# Patient Record
Sex: Female | Born: 1940 | Race: Black or African American | Hispanic: No | Marital: Single
Health system: Southern US, Community
[De-identification: ages and names within clinical notes are randomized; demographics above are authoritative.]

## PROBLEM LIST (undated history)

## (undated) DIAGNOSIS — G9341 Metabolic encephalopathy: Secondary | ICD-10-CM

## (undated) DIAGNOSIS — E079 Disorder of thyroid, unspecified: Secondary | ICD-10-CM

## (undated) DIAGNOSIS — M5440 Lumbago with sciatica, unspecified side: Secondary | ICD-10-CM

## (undated) DIAGNOSIS — B353 Tinea pedis: Secondary | ICD-10-CM

## (undated) DIAGNOSIS — F0280 Dementia in other diseases classified elsewhere without behavioral disturbance: Secondary | ICD-10-CM

## (undated) DIAGNOSIS — G309 Alzheimer's disease, unspecified: Secondary | ICD-10-CM

---

## 2005-05-10 ENCOUNTER — Ambulatory Visit: Payer: Self-pay | Admitting: Emergency Medicine

## 2006-05-13 ENCOUNTER — Ambulatory Visit: Payer: Self-pay | Admitting: Family Medicine

## 2007-07-03 ENCOUNTER — Ambulatory Visit: Payer: Self-pay

## 2007-12-24 ENCOUNTER — Ambulatory Visit: Payer: Self-pay

## 2008-03-17 ENCOUNTER — Ambulatory Visit: Payer: Self-pay | Admitting: Unknown Physician Specialty

## 2008-07-13 ENCOUNTER — Ambulatory Visit: Payer: Self-pay

## 2009-10-12 ENCOUNTER — Ambulatory Visit: Payer: Self-pay | Admitting: Family Medicine

## 2011-02-27 ENCOUNTER — Ambulatory Visit: Payer: Self-pay | Admitting: Urology

## 2011-04-09 ENCOUNTER — Ambulatory Visit: Payer: Self-pay | Admitting: Urology

## 2011-07-31 ENCOUNTER — Ambulatory Visit: Payer: Self-pay | Admitting: Ophthalmology

## 2011-07-31 DIAGNOSIS — I119 Hypertensive heart disease without heart failure: Secondary | ICD-10-CM

## 2011-08-08 ENCOUNTER — Ambulatory Visit: Payer: Self-pay | Admitting: Ophthalmology

## 2012-01-14 ENCOUNTER — Ambulatory Visit: Payer: Self-pay | Admitting: Unknown Physician Specialty

## 2012-01-21 ENCOUNTER — Ambulatory Visit: Payer: Self-pay | Admitting: Unknown Physician Specialty

## 2013-01-26 ENCOUNTER — Ambulatory Visit: Payer: Self-pay | Admitting: Unknown Physician Specialty

## 2013-01-28 ENCOUNTER — Ambulatory Visit: Payer: Self-pay | Admitting: Unknown Physician Specialty

## 2013-08-19 LAB — CBC WITH DIFFERENTIAL/PLATELET
Basophil #: 0 10*3/uL (ref 0.0–0.1)
Basophil %: 0.4 %
HCT: 34.4 % — ABNORMAL LOW (ref 35.0–47.0)
HGB: 11.8 g/dL — ABNORMAL LOW (ref 12.0–16.0)
MCHC: 34.3 g/dL (ref 32.0–36.0)
MCV: 98 fL (ref 80–100)
Monocyte %: 7.8 %
Neutrophil #: 4.2 10*3/uL (ref 1.4–6.5)
Platelet: 164 10*3/uL (ref 150–440)
RDW: 14.2 % (ref 11.5–14.5)

## 2013-08-20 ENCOUNTER — Inpatient Hospital Stay: Payer: Self-pay | Admitting: Internal Medicine

## 2013-08-20 LAB — COMPREHENSIVE METABOLIC PANEL
Albumin: 4 g/dL (ref 3.4–5.0)
Anion Gap: 7 (ref 7–16)
Bilirubin,Total: 0.4 mg/dL (ref 0.2–1.0)
Calcium, Total: 8.4 mg/dL — ABNORMAL LOW (ref 8.5–10.1)
Co2: 29 mmol/L (ref 21–32)
Creatinine: 0.86 mg/dL (ref 0.60–1.30)
EGFR (African American): >60
EGFR (Non-African Amer.): >60
Glucose: 134 mg/dL — ABNORMAL HIGH (ref 65–99)
SGOT(AST): 72 U/L — ABNORMAL HIGH (ref 15–37)
Total Protein: 7.6 g/dL (ref 6.4–8.2)

## 2013-08-20 LAB — URINALYSIS, COMPLETE
Bacteria: NONE SEEN
Glucose,UR: NEGATIVE mg/dL (ref 0–75)
Ketone: NEGATIVE
Leukocyte Esterase: NEGATIVE
Nitrite: NEGATIVE
Ph: 8 (ref 4.5–8.0)
Protein: 100
Specific Gravity: 1.01 (ref 1.003–1.030)
WBC UR: 2 /HPF (ref 0–5)

## 2013-08-20 LAB — DRUG SCREEN, URINE
Barbiturates, Ur Screen: NEGATIVE (ref ?–200)
Benzodiazepine, Ur Scrn: NEGATIVE (ref ?–200)
MDMA (Ecstasy)Ur Screen: NEGATIVE (ref ?–500)
Methadone, Ur Screen: NEGATIVE (ref ?–300)
Opiate, Ur Screen: NEGATIVE (ref ?–300)

## 2013-08-20 LAB — SEDIMENTATION RATE: Erythrocyte Sed Rate: 29 mm/hr (ref 0–30)

## 2013-08-20 LAB — FOLATE: Folic Acid: 11.8 ng/mL (ref 3.1–100.0)

## 2013-08-20 LAB — TROPONIN I: Troponin-I: <0.02 ng/mL

## 2013-08-20 LAB — CK TOTAL AND CKMB (NOT AT ARMC)
CK, Total: 970 U/L — ABNORMAL HIGH (ref 21–215)
CK, Total: 715 U/L — ABNORMAL HIGH (ref 21–215)
CK-MB: 8.5 ng/mL — ABNORMAL HIGH (ref 0.5–3.6)
CK-MB: 5.1 ng/mL — ABNORMAL HIGH (ref 0.5–3.6)

## 2013-08-20 LAB — T4, FREE: Free Thyroxine: 0.3 ng/dL — ABNORMAL LOW (ref 0.76–1.46)

## 2013-08-20 LAB — TSH: Thyroid Stimulating Horm: 96.4 u[IU]/mL — ABNORMAL HIGH

## 2013-08-21 LAB — CBC WITH DIFFERENTIAL/PLATELET
Basophil %: 0.7 %
Eosinophil #: 0 10*3/uL (ref 0.0–0.7)
Eosinophil %: 0.6 %
HCT: 31.1 % — ABNORMAL LOW (ref 35.0–47.0)
HGB: 10.7 g/dL — ABNORMAL LOW (ref 12.0–16.0)
MCHC: 34.4 g/dL (ref 32.0–36.0)
MCV: 98 fL (ref 80–100)
Monocyte #: 0.4 x10 3/mm (ref 0.2–0.9)
Monocyte %: 7.5 %
Neutrophil #: 3 10*3/uL (ref 1.4–6.5)
RBC: 3.18 10*6/uL — ABNORMAL LOW (ref 3.80–5.20)
WBC: 5 10*3/uL (ref 3.6–11.0)

## 2013-08-21 LAB — BASIC METABOLIC PANEL
Anion Gap: 6 — ABNORMAL LOW (ref 7–16)
Calcium, Total: 8.2 mg/dL — ABNORMAL LOW (ref 8.5–10.1)
Chloride: 106 mmol/L (ref 98–107)
Co2: 27 mmol/L (ref 21–32)
Creatinine: 1.05 mg/dL (ref 0.60–1.30)
EGFR (African American): >60
EGFR (Non-African Amer.): 52 — ABNORMAL LOW
Glucose: 138 mg/dL — ABNORMAL HIGH (ref 65–99)
Potassium: 3.8 mmol/L (ref 3.5–5.1)
Sodium: 139 mmol/L (ref 136–145)

## 2013-08-21 LAB — CK TOTAL AND CKMB (NOT AT ARMC): CK, Total: 562 U/L — ABNORMAL HIGH (ref 21–215)

## 2013-08-22 LAB — BASIC METABOLIC PANEL
Calcium, Total: 8 mg/dL — ABNORMAL LOW (ref 8.5–10.1)
Chloride: 107 mmol/L (ref 98–107)
Co2: 26 mmol/L (ref 21–32)
EGFR (African American): >60
EGFR (Non-African Amer.): 52 — ABNORMAL LOW
Glucose: 130 mg/dL — ABNORMAL HIGH (ref 65–99)
Potassium: 4 mmol/L (ref 3.5–5.1)

## 2013-08-23 LAB — MAGNESIUM: Magnesium: 1.8 mg/dL

## 2013-08-23 LAB — BASIC METABOLIC PANEL
Anion Gap: 5 — ABNORMAL LOW (ref 7–16)
BUN: 10 mg/dL (ref 7–18)
Chloride: 105 mmol/L (ref 98–107)
Co2: 29 mmol/L (ref 21–32)
Creatinine: 1.08 mg/dL (ref 0.60–1.30)
Glucose: 118 mg/dL — ABNORMAL HIGH (ref 65–99)
Potassium: 3.9 mmol/L (ref 3.5–5.1)
Sodium: 139 mmol/L (ref 136–145)

## 2013-08-23 LAB — CK: CK, Total: 380 U/L — ABNORMAL HIGH (ref 21–215)

## 2013-08-24 LAB — BASIC METABOLIC PANEL
Calcium, Total: 8.7 mg/dL (ref 8.5–10.1)
Chloride: 103 mmol/L (ref 98–107)
EGFR (African American): 58 — ABNORMAL LOW
Glucose: 112 mg/dL — ABNORMAL HIGH (ref 65–99)
Osmolality: 276 (ref 275–301)
Potassium: 3.9 mmol/L (ref 3.5–5.1)
Sodium: 138 mmol/L (ref 136–145)

## 2013-08-24 LAB — MAGNESIUM: Magnesium: 2 mg/dL

## 2015-02-18 NOTE — Consult Note (Signed)
PATIENT NAME:  Natalie Stevenson, Natalie MR#:  [id number] DATE OF BIRTH:  [birth year]  DATE OF CONSULTATION:  08/20/2013  REQUESTING PROVIDER:  Huey Bienenstockawood  Elgergawy, MD CONSULTING PHYSICIAN:  A. Wendall MolaMelissa Aliannah Holstrom, MD  CHIEF COMPLAINT: Hypothyroidism.   HISTORY OF PRESENT ILLNESS: This is a 74 year old female seen in consultation at the request of Dr. Randol KernElgergawy for hypothyroidism. The patient was admitted yesterday for altered mental status. She lives at home with a sibling. Apparently she called EMS with some vague complaints. At this time, she cannot remember why she called EMS or what issue she was having. On presentation, she was found to have elevated blood pressure 167/92, normal pulse of 64, and labs were notable for an elevated glucose of 134, low potassium of 3.2, elevated CK of 970, and a very elevated TSH levels of 96.4 with a low free T4 of 0.30. She was confused and notably thin.   In reviewing her records at [facility], I see she was previously followed by Dr. Silver HugueninAileen Miller. She established care with Dr. Hyacinth MeekerMiller in May 2009 for hyperthyroidism. 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 She was managed medically with methimazole initially and underwent I-131 ablation in March 2013. Due to persistence of hyperthyroidism, she was initiated on methimazole after that treatment for a time period. In April 2014, she underwent a repeat radioiodine ablation. She was advised not to continue her methimazole after that treatment, and also at a follow-up visit on 02/09/2013 she was again reminded not to take methimazole. She did not show up for any following labs which were scheduled in May 2014 and August 2014, and also missed a followup visit with me in August 2014. It is not clear whether she has been taking methimazole; however, apparently when she presented yesterday, she had prescriptions for methimazole which has been filled as recently as last month. The patient is not knowledgeable about  her medications at this time. She states she manages her own medications and use a weekly pill box, but does not know the names of these medicines. She repeatedly mentions that she has been caring for 2 sick sisters, I believe one lives locally and one lives up in [place]. I believe she traveled to [place] for a time period this spring.   At this time, she denies neck discomfort. No cold intolerance. Denies constipation. She reports a weight loss, but does not know the amount she has lost. She states her clothes have just gotten gradually more baggy. In reviewing records, her weight has been between 108 - 118 pounds over the last 18 months, and therefore weight is stable at this time. She denies dyspnea. She denies leg edema. She denies recent skin changes or hair loss.   PAST MEDICAL HISTORY: 1. 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  2. Postablative hypothyroidism.  3. Hypertension.   SOCIAL HISTORY: The patient lives with a sister. She denies use of tobacco or alcohol.   FAMILY HISTORY: No known thyroid disease.   ALLERGIES: No known drug allergies.   CURRENT INPATIENT MEDICATIONS:  1. Levothyroxine total of 250 mcg were given today.  2. Norvasc 10 mg daily.  3. Hydrochlorothiazide 25 mg daily.  4. Heparin 5000 units subcutaneous q.8 hours.  5. Quinapril 20 mg daily.   REVIEW OF SYSTEMS:  GENERAL: No fever. No evidence of weight loss.  HEENT: No blurred vision. No sore throat.  NECK: No neck pain. No dysphasia.  CARDIAC: Denies chest pain. Denies  palpitations.  PULMONARY: Denies cough or shortness of breath at rest.  ABDOMEN: Reports poor appetite. Denies constipation.  EXTREMITIES: Denies leg swelling.  SKIN: Denies rash or recent skin changes.  ENDOCRINE: Denies heat or cold intolerance.  GENITOURINARY: Denies polyuria or dysuria. HEMATOLOGIC:  Denies recent bleeding or easy bruisability.   PHYSICAL  EXAMINATION: VITAL SIGNS: Weight is 115 pounds, BMI 52.2, temperature 98, pulse 76, respirations 18, blood pressure 120/62, pulse oximetry 100% on room air.  GENERAL: Elderly African American female in no acute distress.  HEENT: EOMI. Oropharynx is clear. Mucous membranes moist.  NECK: Supple. Thyroid modestly enlarged with an irregular texture. No elicited tenderness.  CARDIAC: Regular rate and rhythm without murmur.  PULMONARY: Clear to auscultation bilaterally. Good inspiratory effort.   ABDOMEN: Soft, nontender, nondistended.  EXTREMITIES: No edema is present.  SKIN: No rash or dermatopathy is present.  PSYCHIATRIC: Alert, oriented, cooperative.   LABORATORY DATA: 08/19/2013: TSH 96.4 uIU/ml and free T4 of 0.30 ng/DL.   ASSESSMENT: Postablative hypothyroidism, uncontrolled.   RECOMMENDATIONS: 1. Agree with initiation of levothyroxine. Based on her weight of 52 kg, a full replacement dose would be approximately 1.6 units/kg grams or 88 mcg per day. I will start the 88 mcg per day tomorrow morning.  2. She was advised not to take methimazole. This was written down and reviewed with her at length.  3. She was advised to take levothyroxine each morning on an empty stomach, preferably about 30 minutes before eating her breakfast. She should separate the levothyroxine from other medications, if possible.  4. I will arrange an outpatient visit in about one month. We will reassess thyroid function tests at that time. This was scheduled for 11/17 an 1:00 p.m. I counseled her on the importance of this follow-up visit and reminded her where the clinic was located.   I will sign off, but I am available if there are any questions or concerns.    ____________________________ A. Wendall Mola, MD ams:sg D: 08/20/2013 14:01:08 ET T: 08/20/2013 14:35:30 ET JOB#: [id number]  cc: A. Wendall Mola, MD, <Dictator> Macy Mis MD ELECTRONICALLY SIGNED 08/25/2013 15:55

## 2015-02-18 NOTE — H&P (Signed)
PATIENT NAME:  Natalie Stevenson, Natalie Stevenson MR#:  [id number] DATE OF BIRTH:  [birth year]  DATE OF ADMISSION:  08/19/2013  REFERRING PHYSICIAN: Graciella Freer, MD  PRIMARY CARE PHYSICIAN: Unclear who is she seeing currently, but her current pills states this physician's name, Dr. Marcille Blanco.   PRIMARY ENDOCRINOLOGIST: Used to follow with Dr. Kem Kays until April 2014; unclear who she was following with after that.  CHIEF COMPLAINT: Confusion.   HISTORY OF PRESENT ILLNESS: This is a 74 year old female with significant past medical history of toxic multinodular goiter, hypertension, used to be followed by Dr. Kem Kays for her hyperthyroidism. She got thyroid ablation x 2, one in March 2013 and second in April 2014, where she was on Tapazole for her hyperthyroidism, where most recent thyroid function tests from [facility] were obtained showing her still being significantly hyperthyroid with very low TSH on Tapazole. There is no previous documentation on the patient after that. She lives at home, appears to be living by herself, where she called EMS. When they presented, she told them she wanted to check herself for C. diff. She reports her sister is admitted somewhere with C. diff colitis. In the ED, the patient's vital signs were significant for uncontrolled blood pressure. Her blood work was significant for mild hypokalemia and significantly elevated TSH at 96 and low free T4 level at 0.3. The patient appears to be totally confused, cannot give any reliable history. She only can answer her name. Other than that, cannot give any reliable history or review of systems or past medical history. She cannot recall her physician's name or her current endocrinologist. As well, she does not know her medication. By checking her bags, the patient was found to have a few bottles including Tapazole, last time refilled on July 13, 2013 with a few pills left in it, and the one prior to that filled on June 12, 2013,  which appears to be empty. As well, she had empty bottle of Norvasc and vitamin D. Unclear what is her current medication at this point and if she was taking the Tapazole or not. As well, it is unclear who is she following with currently, too. Last visit she had to Dr. Sabra Heck was in April of this year and unclear who is her primary care currently. The patient's CT head did not show any acute findings. Urinalysis was negative. The patient was afebrile, had no leukocytosis, had mildly elevated LFTs. Hospitalist service was requested to admit the patient for further evaluation of her confusion. The patient as well was found to be hypertensive at one point where systolic blood pressure was more than 200s.   I tried to obtain further history from the patient's family members, went back into her medical records and called the numbers we have in the record to try to obtain any information with no one answering.  PAST SURGICAL HISTORY:  1.  Multinodular toxic goiter status post radioablation, currently hypothyroid.  2.  Hypertension.   SOCIAL HISTORY: Denies any smoking, any drinking, any drug use.   FAMILY HISTORY: Tried to obtain. The patient could not answer.   REVIEW OF SYSTEMS: The patient cannot answer questions appropriately, cannot obtain reliable review of systems.   HOME MEDICATIONS: As mentioned earlier, unclear what is her medication, what is her current medication list, but found bottle of Tapazole 10 mg oral daily in her bag, Norvasc 5 mg oral daily, empty bottle, and vitamin D weekly, empty bottle.  ALLERGIES: No known drug allergies.   PHYSICAL  EXAMINATION: VITAL SIGNS: Temperature 97.9, pulse 64, respiratory rate 12, blood pressure 167/92. Saturating 99% on room air.  GENERAL: Cachectic, malnourished elderly female who looks comfortable, in no apparent distress.  HEENT: Head atraumatic, normocephalic. Pupils are equal and reactive to light. Pink conjunctivae. Anicteric sclerae. Moist  oral mucosa.  NECK: Supple. No thyromegaly. No JVD.  CHEST: Good air entry bilaterally. No wheezing, rales, rhonchi.  CARDIOVASCULAR: S1 and S2 heard. No rubs, murmurs or gallops.  ABDOMEN: Soft, nontender, nondistended. Bowel sounds present.  EXTREMITIES: No edema. No clubbing. No cyanosis. Dorsalis pedis pulse +2 bilaterally.  PSYCHIATRIC: The patient is awake, confused. Only answers to her name.  NEUROLOGIC: Cranial nerves grossly intact. Motor: No focal deficits appreciated. The patient had deep tendon reflexes +2 bilaterally, symmetrical.  LYMPH: No cervical or supraclavicular lymphadenopathy.  SKIN: Normal skin turgor, warm and dry.   PERTINENT LABORATORY DATA: Glucose 134, BUN 8, creatinine 0.86, sodium 137, potassium 3.2, chloride 101, CO2 29, ALT 104, AST 72, alk phos 111. Troponin less than 0.02, CK-MB 8.5, CK total 970. TSH 96.4. Free T4 0.3. White blood cells 6.4, hemoglobin 11.8, hematocrit 34.4, platelets 164. Urinalysis negative for leukocyte esterase and nitrite.   IMAGING STUDIES: CT head without contrast showing no acute intracranial pathology.   DIAGNOSTICS: EKG showing normal sinus rhythm at 72 beats per minute. No ST or T wave changes.   ASSESSMENT AND PLAN: 1.  Confusion, unclear at this point how acute is this episode or what is the patient's baseline, but it appears she was living by herself, so she had to be at better functional status than her current mental condition. CT head does not show any acute findings. Her confusion most likely related to her severe hypothyroidism. The patient was given 1 p.o. thyroid 100 mcg. We will start her on Synthroid daily. We will check her B63, folic acid and ammonia level as well. The patient does not appear to be having any focal neurological deficits.  2.  Hypertension, uncontrolled. Unclear as well if the patient was taking her medications or not. Will start her on p.r.n. hydralazine. We will start her on quinapril 40 mg daily,  Norvasc 5 mg and hydrochlorothiazide 25 mg as she appears to have been on these meds in the past.  3.  Hypothyroidism. This is status post ablation for hyperthyroidism. Will consult endocrinology service. We will start the patient on p.o. synthroid. The patient does not appear to be in myxedema coma is there no indication for IV Synthroid at this point, especially with her frail status and old age. 4.  Hypokalemia. We will replace. We will recheck in a.m.  5.  Elevated liver function tests. This is most likely related to Tapazole. Will monitor closely. 6.  Deep vein thrombosis prophylaxis. Subcutaneous heparin.   CODE STATUS: The patient is FULL CODE.   TOTAL TIME SPENT ON ADMISSION AND PATIENT CARE: 55 minutes.  ____________________________ Albertine Patricia, MD dse:sb D: 08/20/2013 06:57:42 ET T: 08/20/2013 07:36:58 ET  JOB#: [id number] Demetric Dunnaway Graciela Husbands MD ELECTRONICALLY SIGNED 08/21/2013 3:07
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
## 2017-05-16 ENCOUNTER — Ambulatory Visit (HOSPITAL_COMMUNITY)
Admission: RE | Admit: 2017-05-16 | Discharge: 2017-05-16 | Disposition: A | Discharge status: Home / Self Care | Payer: Medicare Other | Source: Ambulatory Visit | Attending: Internal Medicine | Admitting: Internal Medicine

## 2017-05-16 ENCOUNTER — Other Ambulatory Visit (HOSPITAL_COMMUNITY): Payer: Self-pay | Admitting: Internal Medicine

## 2017-05-16 DIAGNOSIS — M160 Bilateral primary osteoarthritis of hip: Secondary | ICD-10-CM | POA: Insufficient documentation

## 2017-05-16 DIAGNOSIS — X58XXXD Exposure to other specified factors, subsequent encounter: Secondary | ICD-10-CM | POA: Diagnosis not present

## 2017-05-16 DIAGNOSIS — M25551 Pain in right hip: Secondary | ICD-10-CM

## 2017-05-16 DIAGNOSIS — K5900 Constipation, unspecified: Secondary | ICD-10-CM

## 2017-05-16 DIAGNOSIS — R195 Other fecal abnormalities: Secondary | ICD-10-CM | POA: Insufficient documentation

## 2017-05-16 DIAGNOSIS — M25552 Pain in left hip: Secondary | ICD-10-CM

## 2017-05-16 DIAGNOSIS — S32501D Unspecified fracture of right pubis, subsequent encounter for fracture with routine healing: Secondary | ICD-10-CM | POA: Diagnosis not present

## 2017-12-04 ENCOUNTER — Encounter (HOSPITAL_COMMUNITY): Payer: Self-pay | Admitting: Emergency Medicine

## 2017-12-04 ENCOUNTER — Emergency Department (HOSPITAL_COMMUNITY): Payer: Medicare Other

## 2017-12-04 ENCOUNTER — Other Ambulatory Visit: Payer: Self-pay

## 2017-12-04 ENCOUNTER — Emergency Department (HOSPITAL_COMMUNITY)
Admission: EM | Admit: 2017-12-04 | Discharge: 2017-12-04 | Disposition: A | Discharge status: Nursing Home (Includes ICF) | Payer: Medicare Other | Attending: Emergency Medicine | Admitting: Emergency Medicine

## 2017-12-04 DIAGNOSIS — F0280 Dementia in other diseases classified elsewhere without behavioral disturbance: Secondary | ICD-10-CM | POA: Insufficient documentation

## 2017-12-04 DIAGNOSIS — E039 Hypothyroidism, unspecified: Secondary | ICD-10-CM | POA: Diagnosis not present

## 2017-12-04 DIAGNOSIS — R4182 Altered mental status, unspecified: Secondary | ICD-10-CM | POA: Insufficient documentation

## 2017-12-04 DIAGNOSIS — R55 Syncope and collapse: Secondary | ICD-10-CM | POA: Diagnosis present

## 2017-12-04 DIAGNOSIS — G309 Alzheimer's disease, unspecified: Secondary | ICD-10-CM | POA: Insufficient documentation

## 2017-12-04 HISTORY — DX: Lumbago with sciatica, unspecified side: M54.40

## 2017-12-04 HISTORY — DX: Alzheimer's disease, unspecified: G30.9

## 2017-12-04 HISTORY — DX: Metabolic encephalopathy: G93.41

## 2017-12-04 HISTORY — DX: Disorder of thyroid, unspecified: E07.9

## 2017-12-04 HISTORY — DX: Dementia in other diseases classified elsewhere, unspecified severity, without behavioral disturbance, psychotic disturbance, mood disturbance, and anxiety: F02.80

## 2017-12-04 HISTORY — DX: Tinea pedis: B35.3

## 2017-12-04 LAB — URINALYSIS, ROUTINE W REFLEX MICROSCOPIC
Bilirubin Urine: NEGATIVE
Glucose, UA: NEGATIVE mg/dL
Hgb urine dipstick: NEGATIVE
Ketones, ur: NEGATIVE mg/dL
Nitrite: NEGATIVE
PH: 7 (ref 5.0–8.0)
Protein, ur: NEGATIVE mg/dL
Specific Gravity, Urine: 1.011 (ref 1.005–1.030)

## 2017-12-04 LAB — BASIC METABOLIC PANEL
Anion gap: 10 (ref 5–15)
BUN: 13 mg/dL (ref 6–20)
CHLORIDE: 102 mmol/L (ref 101–111)
CO2: 26 mmol/L (ref 22–32)
CREATININE: 0.83 mg/dL (ref 0.44–1.00)
Calcium: 9.1 mg/dL (ref 8.9–10.3)
GFR calc Af Amer: >60 mL/min (ref >60)
GFR calc non Af Amer: >60 mL/min (ref >60)
GLUCOSE: 100 mg/dL — AB (ref 65–99)
Potassium: 4.3 mmol/L (ref 3.5–5.1)
SODIUM: 138 mmol/L (ref 135–145)

## 2017-12-04 LAB — CBC
HCT: 38.5 % (ref 36.0–46.0)
Hemoglobin: 12.1 g/dL (ref 12.0–15.0)
MCH: 29.8 pg (ref 26.0–34.0)
MCHC: 31.4 g/dL (ref 30.0–36.0)
MCV: 94.8 fL (ref 78.0–100.0)
PLATELETS: 188 10*3/uL (ref 150–400)
RBC: 4.06 MIL/uL (ref 3.87–5.11)
RDW: 12.8 % (ref 11.5–15.5)
WBC: 7 10*3/uL (ref 4.0–10.5)

## 2017-12-04 MED ORDER — SODIUM CHLORIDE 0.9 % IV BOLUS (SEPSIS)
500.0000 mL | Freq: Once | INTRAVENOUS | Status: AC
  Administered 2017-12-04: 500 mL via INTRAVENOUS

## 2017-12-04 NOTE — ED Notes (Signed)
Pt pulled out iv and took cardiac leads off.  Pt will be moved to room 14 to be better visualized by staff.

## 2017-12-04 NOTE — ED Notes (Signed)
[facility] notified of discharge. They will come and pick pt up

## 2017-12-04 NOTE — ED Triage Notes (Signed)
Per EMS, pt had witnessed syncopal episode by PA that lasted approximately 7-8 minutes. Pt alert and at baseline per facility staff. cbg 132 en route.

## 2017-12-04 NOTE — ED Notes (Signed)
Transportation is on there way to pick up resident

## 2017-12-04 NOTE — ED Notes (Signed)
Patient transported to CT 

## 2017-12-04 NOTE — ED Notes (Signed)
Urine collected and taken to lab.

## 2017-12-04 NOTE — ED Provider Notes (Signed)
[facility] EMERGENCY DEPARTMENT Provider Note   CSN: [id number] Arrival date & time: 12/04/17  1155   LEVEL 5 CAVEAT - DEMENTIA  History   Chief Complaint Chief Complaint  Patient presents with  . Loss of Consciousness    HPI Natalie Stevenson is a 77 y.o. female.  HPI  77 year old female presents after being found unresponsive.  The history is limited as the patient has Alzheimer's dementia and does not know why or where she is.  Report from the nursing home I called states that a resident of the facility found her sitting in a chair unresponsive.  The physical therapist went in to check on her and could not arouse her despite sternal rub.  She was in the state for about 7-10 minutes.  She was breathing normally. She now seems to be back to her normal.  Otherwise no acute complaints.  When talking to the patient she denies any type of pain including no headache.  Past Medical History:  Diagnosis Date  . Alzheimer's dementia   . Lumbago with sciatica   . Metabolic encephalopathy   . Thyroid disease    hypothyroidism  . Tinea pedis     There are no active problems to display for this patient.   History reviewed. No pertinent surgical history.  OB History    No data available       Home Medications    Prior to Admission medications   Not on File    Family History History reviewed. No pertinent family history.  Social History Social History   Tobacco Use  . Smoking status: Unknown If Ever Smoked  Substance Use Topics  . Alcohol use: Not on file  . Drug use: Not on file     Allergies   Patient has no allergy information on record.   Review of Systems Review of Systems  Unable to perform ROS: Dementia     Physical Exam Updated Vital Signs BP (!) 144/95 (BP Location: Right Arm)   Pulse 73   Temp 98.6 F (37 C) (Oral)   Resp 19   Ht 5\' 6"  (1.676 m)   Wt 52.2 kg (115 lb)   SpO2 100%   BMI 18.56 kg/m   Physical Exam  Constitutional: She  appears well-developed and well-nourished. No distress.  HENT:  Head: Normocephalic and atraumatic.  Right Ear: External ear normal.  Left Ear: External ear normal.  Nose: Nose normal.  Eyes: EOM are normal. Pupils are equal, round, and reactive to light. Right eye exhibits no discharge. Left eye exhibits no discharge.  Neck: Neck supple.  Cardiovascular: Normal rate, regular rhythm and normal heart sounds.  No murmur heard. Pulmonary/Chest: Effort normal and breath sounds normal.  Abdominal: Soft. There is no tenderness.  Neurological: She is alert. She is disoriented.  Alert, oriented to self but disoriented to time, place and situation. CN 3-12 grossly intact. 5/5 strength in all 4 extremities. Grossly normal sensation. Normal finger to nose.   Skin: Skin is warm and dry. She is not diaphoretic.  Nursing note and vitals reviewed.    ED Treatments / Results  Labs (all labs ordered are listed, but only abnormal results are displayed) Labs Reviewed  BASIC METABOLIC PANEL - Abnormal; Notable for the following components:      Result Value   Glucose, Bld 100 (*)    All other components within normal limits  URINALYSIS, ROUTINE W REFLEX MICROSCOPIC - Abnormal; Notable for the following components:   Leukocytes, UA  TRACE (*)    Bacteria, UA RARE (*)    Squamous Epithelial / LPF 0-5 (*)    All other components within normal limits  CBC    EKG  EKG Interpretation  Date/Time:  Wednesday December 04 2017 12:06:25 EST Ventricular Rate:  69 PR Interval:    QRS Duration: 97 QT Interval:  395 QTC Calculation: 424 R Axis:   73 Text Interpretation:  Sinus rhythm T wave inversion V2 no longer present otherwise no significant change since 2014 Confirmed by Pricilla Loveless [phone]) on 12/04/2017 12:24:23 PM       Radiology Ct Head Wo Contrast  Result Date: 12/04/2017 CLINICAL DATA:  Syncopal episode today. EXAM: CT HEAD WITHOUT CONTRAST TECHNIQUE: Contiguous axial images were obtained  from the base of the skull through the vertex without intravenous contrast. COMPARISON:  None. FINDINGS: Brain: No evidence of acute infarction, hemorrhage, hydrocephalus, extra-axial collection or mass lesion/mass effect. Mild to moderate atrophy. Hypoattenuation of white matter, likely small vessel disease. Vascular: Calcification of the cavernous internal carotid arteries consistent with cerebrovascular atherosclerotic disease. No signs of intracranial large vessel occlusion. Skull: Normal. Negative for fracture or focal lesion. Sinuses/Orbits: No acute finding. Other: None. IMPRESSION: Atrophy.  White matter disease.  No acute intracranial findings. Electronically Signed   By: Elsie Stain M.D.   On: 12/04/2017 14:33    Procedures Procedures (including critical care time)  Medications Ordered in ED Medications  sodium chloride 0.9 % bolus 500 mL (0 mLs Intravenous Stopped 12/04/17 1434)     Initial Impression / Assessment and Plan / ED Course  I have reviewed the triage vital signs and the nursing notes.  Pertinent labs & imaging results that were available during my care of the patient were reviewed by me and considered in my medical decision making (see chart for details).     No clear cause for the patient's transient altered state.  At this point she appears to be back to her normal.  I doubt this was a true syncopal episode as the facility reports she was hard to arouse for 10 minutes.  She was breathing normally during this time.  It could be medication effect as she is on multiple different medications.  However given she is awake, alert, and at her baseline with unremarkable vital signs besides some moderate hypertension, I do not think further workup or admission is needed.  She appears stable for discharge home.  I discussed with her cousin at the bedside.  I called the niece and power of attorney but she did not answer on multiple attempts.  Discharge back to her facility with return  precautions.  Final Clinical Impressions(s) / ED Diagnoses   Final diagnoses:  Altered mental status, unspecified altered mental status type    ED Discharge Orders    None       Pricilla Loveless, MD 12/04/17 1535

## 2017-12-04 NOTE — ED Notes (Signed)
Pt ambulated to BR with assistance.

## 2020-02-10 ENCOUNTER — Other Ambulatory Visit: Payer: Self-pay

## 2020-02-10 ENCOUNTER — Emergency Department (HOSPITAL_COMMUNITY): Payer: Medicare Other

## 2020-02-10 ENCOUNTER — Emergency Department (HOSPITAL_COMMUNITY)
Admission: EM | Admit: 2020-02-10 | Discharge: 2020-02-10 | Disposition: A | Discharge status: Home / Self Care | Payer: Medicare Other | Attending: Emergency Medicine | Admitting: Emergency Medicine

## 2020-02-10 ENCOUNTER — Encounter (HOSPITAL_COMMUNITY): Payer: Self-pay | Admitting: Emergency Medicine

## 2020-02-10 DIAGNOSIS — S0101XA Laceration without foreign body of scalp, initial encounter: Secondary | ICD-10-CM | POA: Diagnosis not present

## 2020-02-10 DIAGNOSIS — Y939 Activity, unspecified: Secondary | ICD-10-CM | POA: Diagnosis not present

## 2020-02-10 DIAGNOSIS — S0181XA Laceration without foreign body of other part of head, initial encounter: Secondary | ICD-10-CM

## 2020-02-10 DIAGNOSIS — Y999 Unspecified external cause status: Secondary | ICD-10-CM | POA: Diagnosis not present

## 2020-02-10 DIAGNOSIS — Z79899 Other long term (current) drug therapy: Secondary | ICD-10-CM | POA: Diagnosis not present

## 2020-02-10 DIAGNOSIS — F0280 Dementia in other diseases classified elsewhere without behavioral disturbance: Secondary | ICD-10-CM | POA: Diagnosis not present

## 2020-02-10 DIAGNOSIS — W19XXXA Unspecified fall, initial encounter: Secondary | ICD-10-CM

## 2020-02-10 DIAGNOSIS — S01112A Laceration without foreign body of left eyelid and periocular area, initial encounter: Secondary | ICD-10-CM | POA: Diagnosis not present

## 2020-02-10 DIAGNOSIS — W0110XA Fall on same level from slipping, tripping and stumbling with subsequent striking against unspecified object, initial encounter: Secondary | ICD-10-CM | POA: Insufficient documentation

## 2020-02-10 DIAGNOSIS — S01511A Laceration without foreign body of lip, initial encounter: Secondary | ICD-10-CM | POA: Diagnosis not present

## 2020-02-10 DIAGNOSIS — G309 Alzheimer's disease, unspecified: Secondary | ICD-10-CM | POA: Diagnosis not present

## 2020-02-10 DIAGNOSIS — Y92129 Unspecified place in nursing home as the place of occurrence of the external cause: Secondary | ICD-10-CM | POA: Diagnosis not present

## 2020-02-10 DIAGNOSIS — S0993XA Unspecified injury of face, initial encounter: Secondary | ICD-10-CM | POA: Diagnosis present

## 2020-02-10 DIAGNOSIS — E039 Hypothyroidism, unspecified: Secondary | ICD-10-CM | POA: Insufficient documentation

## 2020-02-10 LAB — CBC WITH DIFFERENTIAL/PLATELET
Abs Immature Granulocytes: 0.04 10*3/uL (ref 0.00–0.07)
Basophils Absolute: 0 10*3/uL (ref 0.0–0.1)
Basophils Relative: 0 %
Eosinophils Absolute: 0 10*3/uL (ref 0.0–0.5)
Eosinophils Relative: 0 %
HCT: 42.2 % (ref 36.0–46.0)
Hemoglobin: 13.5 g/dL (ref 12.0–15.0)
Immature Granulocytes: 0 %
Lymphocytes Relative: 14 %
Lymphs Abs: 1.3 10*3/uL (ref 0.7–4.0)
MCH: 30.8 pg (ref 26.0–34.0)
MCHC: 32 g/dL (ref 30.0–36.0)
MCV: 96.3 fL (ref 80.0–100.0)
Monocytes Absolute: 0.5 10*3/uL (ref 0.1–1.0)
Monocytes Relative: 5 %
Neutro Abs: 7.8 10*3/uL — ABNORMAL HIGH (ref 1.7–7.7)
Neutrophils Relative %: 81 %
Platelets: 163 10*3/uL (ref 150–400)
RBC: 4.38 MIL/uL (ref 3.87–5.11)
RDW: 12.7 % (ref 11.5–15.5)
WBC: 9.7 10*3/uL (ref 4.0–10.5)
nRBC: 0 % (ref 0.0–0.2)

## 2020-02-10 LAB — COMPREHENSIVE METABOLIC PANEL
ALT: 12 U/L (ref 0–44)
AST: 19 U/L (ref 15–41)
Albumin: 3.9 g/dL (ref 3.5–5.0)
Alkaline Phosphatase: 71 U/L (ref 38–126)
Anion gap: 14 (ref 5–15)
BUN: 12 mg/dL (ref 8–23)
CO2: 22 mmol/L (ref 22–32)
Calcium: 9.5 mg/dL (ref 8.9–10.3)
Chloride: 104 mmol/L (ref 98–111)
Creatinine, Ser: 1.08 mg/dL — ABNORMAL HIGH (ref 0.44–1.00)
GFR calc Af Amer: 56 mL/min — ABNORMAL LOW (ref >60)
GFR calc non Af Amer: 48 mL/min — ABNORMAL LOW (ref >60)
Glucose, Bld: 102 mg/dL — ABNORMAL HIGH (ref 70–99)
Potassium: 3.7 mmol/L (ref 3.5–5.1)
Sodium: 140 mmol/L (ref 135–145)
Total Bilirubin: 0.8 mg/dL (ref 0.3–1.2)
Total Protein: 7.2 g/dL (ref 6.5–8.1)

## 2020-02-10 MED ORDER — PENICILLIN V POTASSIUM 500 MG PO TABS: 500.0000 mg | ORAL_TABLET | Freq: Three times a day (TID) | ORAL | 0 refills | Status: AC

## 2020-02-10 MED ORDER — LORAZEPAM 2 MG/ML IJ SOLN
0.5000 mg | Freq: Once | INTRAMUSCULAR | Status: AC
  Administered 2020-02-10: 0.5 mg via INTRAVENOUS
  Filled 2020-02-10: qty 1

## 2020-02-10 MED ORDER — LIDOCAINE HCL (PF) 1 % IJ SOLN
30.0000 mL | Freq: Once | INTRAMUSCULAR | Status: DC
  Filled 2020-02-10: qty 30

## 2020-02-10 MED ORDER — LIDOCAINE HCL (PF) 1 % IJ SOLN
5.0000 mL | Freq: Once | INTRAMUSCULAR | Status: AC
  Administered 2020-02-10: 5 mL
  Filled 2020-02-10: qty 5

## 2020-02-10 NOTE — ED Notes (Signed)
Niece/ POA  Sharlene Dory Would like an update [phone]

## 2020-02-10 NOTE — ED Triage Notes (Signed)
Per [facility], pt fell from a standing position "straight on her face at assisted living facility - [facility] in [place].  According to staff she had a brief LOC, however when EMS arrived she was alert and at baseline w/ dementia (non-verbal).  Laceration above left eye and lip, hematoma to left cheek and top of head.  No blood thinners   150/93 120 HR CBG 141 98% RA  22G Right hand

## 2020-02-10 NOTE — Discharge Instructions (Addendum)
Scalp laceration does not require closure today. Keep area clean. Apply antibiotic ointment to area. Laceration below left eyebrow closed with dermabond- do NOT apply antibiotic ointment to this area. Laceration to lip/mouth closed with dissolvable sutures. Keep wound clean. Give Penicillin to prevent mouth infection.

## 2020-02-10 NOTE — ED Notes (Signed)
Laura (PA) at bedside.

## 2020-02-10 NOTE — ED Notes (Signed)
Pt transported to CT ?

## 2020-02-10 NOTE — ED Provider Notes (Signed)
[facility] EMERGENCY DEPARTMENT Provider Note   CSN: [id number] Arrival date & time: 02/10/20  4680     History Chief Complaint  Patient presents with  . Fall  . Facial Laceration    Allure Greaser is a 79 y.o. female.  79 year old female with history of alzheimer's dementia, metabolic encephalopathy, brought in by EMS from [facility] nursing home for evaluation after a fall. Per EMS, patient was found prone by EMS, concern for brief LOC as witnessed by nursing home staff, EMS reports patient awake on arrival with reportedly baseline dementia. Patient is not on blood thinners. Patient is unable to provide any history today, states she is cold. Level 5 caveat applies.         Past Medical History:  Diagnosis Date  . Alzheimer's dementia (HCC)   . Lumbago with sciatica   . Metabolic encephalopathy   . Thyroid disease    hypothyroidism  . Tinea pedis     There are no problems to display for this patient.   History reviewed. No pertinent surgical history.   OB History   No obstetric history on file.     No family history on file.  Social History   Tobacco Use  . Smoking status: Unknown If Ever Smoked  Substance Use Topics  . Alcohol use: Not on file  . Drug use: Not on file    Home Medications Prior to Admission medications   Medication Sig Start Date End Date Taking? Authorizing Provider  acetaminophen (TYLENOL) 500 MG tablet Take 500 mg by mouth daily.   Yes [provider]  amLODipine (NORVASC) 10 MG tablet Take 10 mg by mouth daily.   Yes [provider]  donepezil (ARICEPT) 10 MG tablet Take 10 mg by mouth daily.   Yes [provider]  levothyroxine (SYNTHROID) 175 MCG tablet Take 175 mcg by mouth daily before breakfast.   Yes [provider]  lisinopril (ZESTRIL) 20 MG tablet Take 20 mg by mouth daily.   Yes [provider]  LORazepam (ATIVAN) 0.5 MG tablet Take 0.5 mg by mouth See admin  instructions. In the morning and once daily as needed for anxiety or agitation   Yes [provider]  melatonin 3 MG TABS tablet Take 3 mg by mouth at bedtime.   Yes [provider]  memantine (NAMENDA) 10 MG tablet Take 10 mg by mouth 2 (two) times daily.   Yes [provider]  penicillin v potassium (VEETID) 500 MG tablet Take 1 tablet (500 mg total) by mouth 3 (three) times daily for 5 days. 02/10/20 02/15/20  Jeannie Fend, PA-C    Allergies    Patient has no known allergies.  Review of Systems   Review of Systems  Unable to perform ROS: Dementia    Physical Exam Updated Vital Signs BP (!) 167/116   Pulse 81   Resp 16   SpO2 100%   Physical Exam Vitals and nursing note reviewed.  Constitutional:      General: She is not in acute distress.    Appearance: She is well-developed. She is not diaphoretic.     Comments: Awake, thin  HENT:     Head: Normocephalic.      Nose: Nose normal.     Mouth/Throat:     Mouth: Mucous membranes are moist.  Eyes:     Pupils: Pupils are equal, round, and reactive to light.  Neck:     Comments: c-collar in  place, no step offs Cardiovascular:     Rate and Rhythm: Normal rate. Rhythm irregular.     Pulses: Normal pulses.  Pulmonary:     Effort: Pulmonary effort is normal.  Abdominal:     Palpations: Abdomen is soft.     Tenderness: There is no abdominal tenderness.  Musculoskeletal:        General: No swelling, tenderness, deformity or signs of injury.     Right lower leg: No edema.     Left lower leg: No edema.     Comments: No pain with ROM extremities, no pain over pelvis. Allows for limited ROM LUE however- no deformity, no bony tenderness.  Skin:    General: Skin is warm and dry.  Neurological:     Sensory: No sensory deficit.  Psychiatric:        Behavior: Behavior normal.     ED Results / Procedures / Treatments   Labs (all labs ordered are listed, but only abnormal results are  displayed) Labs Reviewed  COMPREHENSIVE METABOLIC PANEL - Abnormal; Notable for the following components:      Result Value   Glucose, Bld 102 (*)    Creatinine, Ser 1.08 (*)    GFR calc non Af Amer 48 (*)    GFR calc Af Amer 56 (*)    All other components within normal limits  CBC WITH DIFFERENTIAL/PLATELET - Abnormal; Notable for the following components:   Neutro Abs 7.8 (*)    All other components within normal limits    EKG EKG Interpretation  Date/Time:  Wednesday February 10 2020 08:03:51 EDT Ventricular Rate:  109 PR Interval:    QRS Duration: 81 QT Interval:  316 QTC Calculation: 426 R Axis:   86 Text Interpretation: Sinus tachycardia with irregular rate Borderline right axis deviation Repol abnrm suggests ischemia, diffuse leads Confirmed by Virgel Manifold [id number]) on 02/10/2020 8:58:00 AM   Radiology CT Head Wo Contrast  Result Date: 02/10/2020 CLINICAL DATA:  79 year old female with history of trauma from a fall. Facial trauma. EXAM: CT HEAD WITHOUT CONTRAST CT MAXILLOFACIAL WITHOUT CONTRAST CT CERVICAL SPINE WITHOUT CONTRAST TECHNIQUE: Multidetector CT imaging of the head, cervical spine, and maxillofacial structures were performed using the standard protocol without intravenous contrast. Multiplanar CT image reconstructions of the cervical spine and maxillofacial structures were also generated. COMPARISON:  Head CT 12/04/2017. FINDINGS: CT HEAD FINDINGS Brain: Mild cerebral atrophy. Patchy and confluent areas of decreased attenuation are noted throughout the deep and periventricular white matter of the cerebral hemispheres bilaterally, compatible with chronic microvascular ischemic disease. Physiologic calcifications of the right basal ganglia. No evidence of acute infarction, hemorrhage, hydrocephalus, extra-axial collection or mass lesion/mass effect. Vascular: No hyperdense vessel or unexpected calcification. Skull: Normal. Negative for fracture or focal lesion. Other: None.  CT MAXILLOFACIAL FINDINGS Osseous: No fracture or mandibular dislocation. No destructive process. Orbits: Negative. No traumatic or inflammatory finding. Sinuses: Clear. Soft tissues: Negative. CT CERVICAL SPINE FINDINGS Alignment: Normal. Skull base and vertebrae: No acute fracture. In the right C6 lamina there is a well-defined lucency with sharply defined margins which extends through the inferior aspect of the lamina and partially into the spinous process, most compatible with an old healed incomplete fracture. No primary bone lesion or focal pathologic process. Soft tissues and spinal canal: No prevertebral fluid or swelling. No visible canal hematoma. Disc levels: Minimal multilevel degenerative disc disease and facet arthropathy. Upper chest: Negative. Other: None. IMPRESSION: 1. No evidence of significant acute traumatic injury to the skull,  brain, facial bones or cervical spine. 2. Mild cerebral atrophy with chronic microvascular ischemic changes in the cerebral white matter, as above. 3. Minimal multilevel degenerative disc disease and cervical spondylosis, with evidence of remote traumatic injury to the right C6 lamina, as discussed above. Electronically Signed   By: Trudie Reed M.D.   On: 02/10/2020 08:00   CT Cervical Spine Wo Contrast  Result Date: 02/10/2020 CLINICAL DATA:  79 year old female with history of trauma from a fall. Facial trauma. EXAM: CT HEAD WITHOUT CONTRAST CT MAXILLOFACIAL WITHOUT CONTRAST CT CERVICAL SPINE WITHOUT CONTRAST TECHNIQUE: Multidetector CT imaging of the head, cervical spine, and maxillofacial structures were performed using the standard protocol without intravenous contrast. Multiplanar CT image reconstructions of the cervical spine and maxillofacial structures were also generated. COMPARISON:  Head CT 12/04/2017. FINDINGS: CT HEAD FINDINGS Brain: Mild cerebral atrophy. Patchy and confluent areas of decreased attenuation are noted throughout the deep and  periventricular white matter of the cerebral hemispheres bilaterally, compatible with chronic microvascular ischemic disease. Physiologic calcifications of the right basal ganglia. No evidence of acute infarction, hemorrhage, hydrocephalus, extra-axial collection or mass lesion/mass effect. Vascular: No hyperdense vessel or unexpected calcification. Skull: Normal. Negative for fracture or focal lesion. Other: None. CT MAXILLOFACIAL FINDINGS Osseous: No fracture or mandibular dislocation. No destructive process. Orbits: Negative. No traumatic or inflammatory finding. Sinuses: Clear. Soft tissues: Negative. CT CERVICAL SPINE FINDINGS Alignment: Normal. Skull base and vertebrae: No acute fracture. In the right C6 lamina there is a well-defined lucency with sharply defined margins which extends through the inferior aspect of the lamina and partially into the spinous process, most compatible with an old healed incomplete fracture. No primary bone lesion or focal pathologic process. Soft tissues and spinal canal: No prevertebral fluid or swelling. No visible canal hematoma. Disc levels: Minimal multilevel degenerative disc disease and facet arthropathy. Upper chest: Negative. Other: None. IMPRESSION: 1. No evidence of significant acute traumatic injury to the skull, brain, facial bones or cervical spine. 2. Mild cerebral atrophy with chronic microvascular ischemic changes in the cerebral white matter, as above. 3. Minimal multilevel degenerative disc disease and cervical spondylosis, with evidence of remote traumatic injury to the right C6 lamina, as discussed above. Electronically Signed   By: Trudie Reed M.D.   On: 02/10/2020 08:00   DG Pelvis Portable  Result Date: 02/10/2020 CLINICAL DATA:  Larey Seat. Pelvic pain. EXAM: PORTABLE PELVIS 1-2 VIEWS COMPARISON:  None. FINDINGS: Both hips are normally located. No hip fracture. The pubic symphysis and SI joints are intact. No definite acute pelvic fractures. 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 Vascular calcifications are also noted. IMPRESSION: No acute bony findings. Electronically Signed   By: Rudie Meyer M.D.   On: 02/10/2020 07:54   DG Chest Port 1 View  Result Date: 02/10/2020 CLINICAL DATA:  79 year old female with history of trauma from a fall. EXAM: PORTABLE CHEST 1 VIEW COMPARISON:  Chest x-ray 08/19/2013. FINDINGS: Lung volumes are normal. No consolidative airspace disease. No pleural effusions. No pneumothorax. No pulmonary nodule or mass noted. Pulmonary vasculature and the cardiomediastinal silhouette are within normal limits. Atherosclerosis in the thoracic aorta. IMPRESSION: 1.  No radiographic evidence of acute cardiopulmonary disease. 2. Aortic atherosclerosis. Electronically Signed   By: Trudie Reed M.D.   On: 02/10/2020 08:00   CT Maxillofacial WO CM  Result Date: 02/10/2020 CLINICAL DATA:  79 year old female with history of trauma from a fall. Facial trauma. EXAM: CT HEAD WITHOUT CONTRAST CT MAXILLOFACIAL WITHOUT CONTRAST CT CERVICAL SPINE WITHOUT  CONTRAST TECHNIQUE: Multidetector CT imaging of the head, cervical spine, and maxillofacial structures were performed using the standard protocol without intravenous contrast. Multiplanar CT image reconstructions of the cervical spine and maxillofacial structures were also generated. COMPARISON:  Head CT 12/04/2017. FINDINGS: CT HEAD FINDINGS Brain: Mild cerebral atrophy. Patchy and confluent areas of decreased attenuation are noted throughout the deep and periventricular white matter of the cerebral hemispheres bilaterally, compatible with chronic microvascular ischemic disease. Physiologic calcifications of the right basal ganglia. No evidence of acute infarction, hemorrhage, hydrocephalus, extra-axial collection or mass lesion/mass effect. Vascular: No hyperdense vessel or unexpected calcification. Skull: Normal. Negative for fracture or focal lesion. Other: None. CT MAXILLOFACIAL  FINDINGS Osseous: No fracture or mandibular dislocation. No destructive process. Orbits: Negative. No traumatic or inflammatory finding. Sinuses: Clear. Soft tissues: Negative. CT CERVICAL SPINE FINDINGS Alignment: Normal. Skull base and vertebrae: No acute fracture. In the right C6 lamina there is a well-defined lucency with sharply defined margins which extends through the inferior aspect of the lamina and partially into the spinous process, most compatible with an old healed incomplete fracture. No primary bone lesion or focal pathologic process. Soft tissues and spinal canal: No prevertebral fluid or swelling. No visible canal hematoma. Disc levels: Minimal multilevel degenerative disc disease and facet arthropathy. Upper chest: Negative. Other: None. IMPRESSION: 1. No evidence of significant acute traumatic injury to the skull, brain, facial bones or cervical spine. 2. Mild cerebral atrophy with chronic microvascular ischemic changes in the cerebral white matter, as above. 3. Minimal multilevel degenerative disc disease and cervical spondylosis, with evidence of remote traumatic injury to the right C6 lamina, as discussed above. Electronically Signed   By: Trudie Reedaniel  Entrikin M.D.   On: 02/10/2020 08:00    Procedures .Marland Kitchen.Laceration Repair  Date/Time: 02/10/2020 10:27 AM Performed by: Jeannie FendMurphy, Cleotha Tsang A, PA-C Authorized by: Jeannie FendMurphy, Taeko Schaffer A, PA-C   Consent:    Consent obtained:  Verbal and emergent situation (dementia, assumed consent)   Consent given by:  Patient   Risks discussed:  Infection, need for additional repair, pain, poor cosmetic result and poor wound healing   Alternatives discussed:  No treatment and delayed treatment Universal protocol:    Procedure explained and questions answered to patient or proxy's satisfaction: yes     Relevant documents present and verified: yes     Test results available and properly labeled: yes     Imaging studies available: yes     Required blood products,  implants, devices, and special equipment available: yes     Site/side marked: yes     Immediately prior to procedure, a time out was called: yes     Patient identity confirmed:  Arm band and provided demographic data Anesthesia (see MAR for exact dosages):    Anesthesia method:  None Laceration details:    Location:  Face   Face location:  L eyebrow   Length (cm):  1.5   Depth (mm):  3 Repair type:    Repair type:  Simple Pre-procedure details:    Preparation:  Patient was prepped and draped in usual sterile fashion and imaging obtained to evaluate for foreign bodies Exploration:    Hemostasis achieved with:  Direct pressure   Wound exploration: wound explored through full range of motion and entire depth of wound probed and visualized     Wound extent: no foreign bodies/material noted and no underlying fracture noted     Contaminated: no   Treatment:    Area cleansed with:  Saline  Amount of cleaning:  Standard   Irrigation solution:  Sterile saline Skin repair:    Repair method:  Tissue adhesive Approximation:    Approximation:  Close Post-procedure details:    Dressing:  Open (no dressing)   Patient tolerance of procedure:  Tolerated well, no immediate complications .Marland KitchenLaceration Repair  Date/Time: 02/10/2020 10:28 AM Performed by: Jeannie Fend, PA-C Authorized by: Jeannie Fend, PA-C   Consent:    Consent obtained:  Verbal and emergent situation (dementia, assumed consent)   Consent given by:  Patient   Risks discussed:  Infection, need for additional repair, pain, poor cosmetic result and poor wound healing   Alternatives discussed:  No treatment and delayed treatment Universal protocol:    Procedure explained and questions answered to patient or proxy's satisfaction: yes     Relevant documents present and verified: yes     Test results available and properly labeled: yes     Imaging studies available: yes     Required blood products, implants, devices, and  special equipment available: yes     Site/side marked: yes     Immediately prior to procedure, a time out was called: yes     Patient identity confirmed:  Arm band and provided demographic data Anesthesia (see MAR for exact dosages):    Anesthesia method:  Local infiltration   Local anesthetic:  Lidocaine 1% w/o epi Laceration details:    Location:  Lip   Lip location:  Upper lip, full thickness   Vermilion border involved: yes     Height of lip laceration:  More than half vertical height   Length (cm):  0.5   Laceration depth: through and through. Repair type:    Repair type:  Intermediate Pre-procedure details:    Preparation:  Patient was prepped and draped in usual sterile fashion and imaging obtained to evaluate for foreign bodies Exploration:    Hemostasis achieved with:  Direct pressure   Wound exploration: entire depth of wound probed and visualized     Wound extent: no foreign bodies/material noted and no underlying fracture noted   Treatment:    Area cleansed with:  Saline   Amount of cleaning:  Extensive   Irrigation solution:  Sterile saline Skin repair:    Repair method:  Sutures   Suture size:  5-0   Wound skin closure material used: vicryl.   Suture technique: 4 simple interrupted, 1 mattress.   Number of sutures:  5 Approximation:    Approximation:  Close   Vermilion border: poorly aligned   Post-procedure details:    Dressing:  Open (no dressing)   Patient tolerance of procedure:  Tolerated well, no immediate complications   (including critical care time)  Medications Ordered in ED Medications  lidocaine (PF) (XYLOCAINE) 1 % injection 30 mL (has no administration in time range)  lidocaine (PF) (XYLOCAINE) 1 % injection 5 mL (5 mLs Infiltration Given by Other 02/10/20 0850)  LORazepam (ATIVAN) injection 0.5 mg (0.5 mg Intravenous Given 02/10/20 0850)    ED Course  I have reviewed the triage vital signs and the nursing notes.  Pertinent labs & imaging  results that were available during my care of the patient were reviewed by me and considered in my medical decision making (see chart for details).  Clinical Course as of Feb 09 1113  Wed Feb 10, 2020  1607 Call to [facility] to discuss incident with staff and obtain further history, no answer at [facility].   [LM]  30 79yo female brought in by EMS from nursing facility for unwitnessed fall. Patient with history of dementia, is awake, not alert to person/place/events. Laceration to left scalp, left eyebrow, left upper lip. CT head/face/c-spine without acute injury. CXR and XR pelvis without acute injury. CBC and CMP without significant changes. Patient with report of O2 sat in the 80s, O2 was discontinued and patient maintains sats in the 90s without supplemental O2.  Patient was given ativan for agitation and was able to tolerate wound closure well. Left scalp wound does not require closure/bleeding controlled, wound cleaned. Left eyebrow  lac closed with dermabond. Left lip lac closed internal and external with absorbable suture. Patient placed on PCN prophylactically for through and through mouth wound.   [LM]    Clinical Course User Index [LM] Alden Hipp   MDM Rules/Calculators/A&P                      Final Clinical Impression(s) / ED Diagnoses Final diagnoses:  Fall, initial encounter  Facial laceration, initial encounter  Lip laceration, initial encounter  Laceration of scalp, initial encounter    Rx / DC Orders ED Discharge Orders         Ordered    penicillin v potassium (VEETID) 500 MG tablet  3 times daily     02/10/20 1025           Jeannie Fend, PA-C 02/10/20 1114    Ward, Layla Maw, DO 02/11/20 0011

## 2020-02-10 NOTE — ED Notes (Addendum)
RN gave pt warm blankets and was verbally reassuring pt.  She did say thank you and OK.  She did say she was hurting however was unable to identify where.  Placed on 2L [place] as O2 stats were in the low high 80s

## 2020-06-01 ENCOUNTER — Encounter (HOSPITAL_COMMUNITY): Payer: Self-pay

## 2020-06-01 ENCOUNTER — Emergency Department (HOSPITAL_COMMUNITY): Payer: Medicare Other

## 2020-06-01 ENCOUNTER — Emergency Department (HOSPITAL_COMMUNITY)
Admission: EM | Admit: 2020-06-01 | Discharge: 2020-06-01 | Disposition: A | Discharge status: Home / Self Care | Payer: Medicare Other | Attending: Emergency Medicine | Admitting: Emergency Medicine

## 2020-06-01 ENCOUNTER — Other Ambulatory Visit: Payer: Self-pay

## 2020-06-01 DIAGNOSIS — Y998 Other external cause status: Secondary | ICD-10-CM | POA: Diagnosis not present

## 2020-06-01 DIAGNOSIS — S01111A Laceration without foreign body of right eyelid and periocular area, initial encounter: Secondary | ICD-10-CM | POA: Insufficient documentation

## 2020-06-01 DIAGNOSIS — S161XXA Strain of muscle, fascia and tendon at neck level, initial encounter: Secondary | ICD-10-CM

## 2020-06-01 DIAGNOSIS — S0990XA Unspecified injury of head, initial encounter: Secondary | ICD-10-CM

## 2020-06-01 DIAGNOSIS — E039 Hypothyroidism, unspecified: Secondary | ICD-10-CM | POA: Diagnosis not present

## 2020-06-01 DIAGNOSIS — F0390 Unspecified dementia without behavioral disturbance: Secondary | ICD-10-CM | POA: Insufficient documentation

## 2020-06-01 DIAGNOSIS — W19XXXA Unspecified fall, initial encounter: Secondary | ICD-10-CM | POA: Insufficient documentation

## 2020-06-01 DIAGNOSIS — Y9289 Other specified places as the place of occurrence of the external cause: Secondary | ICD-10-CM | POA: Diagnosis not present

## 2020-06-01 DIAGNOSIS — Y9389 Activity, other specified: Secondary | ICD-10-CM | POA: Insufficient documentation

## 2020-06-01 NOTE — ED Notes (Addendum)
Per EDP, pt CT's are back and pt is okay to be discharged at this time.

## 2020-06-01 NOTE — ED Notes (Signed)
ED Secretary notified EMS of transport back to facility.

## 2020-06-01 NOTE — Discharge Instructions (Signed)
Continue medications as before.  Return to the emergency department for severe headache, changes in mental status, seizure activity, or other new and concerning symptoms.

## 2020-06-01 NOTE — ED Provider Notes (Signed)
[facility] EMERGENCY DEPARTMENT Provider Note   CSN: [id number] Arrival date & time: 06/01/20  3212     History Chief Complaint  Patient presents with  . Fall    Natalie Stevenson is a 79 y.o. female.  Patient is an 79 year old female with history of dementia, hypothyroidism.  She presents today for evaluation of fall.  Patient stays in a skilled nursing facility where she was witnessed to have fallen over and struck her head.  She has a laceration lateral to the right eyebrow.  Patient is unable to add any additional history secondary to dementia.  The history is provided by the patient.       Past Medical History:  Diagnosis Date  . Alzheimer's dementia (HCC)   . Lumbago with sciatica   . Metabolic encephalopathy   . Thyroid disease    hypothyroidism  . Tinea pedis     There are no problems to display for this patient.   History reviewed. No pertinent surgical history.   OB History   No obstetric history on file.     History reviewed. No pertinent family history.  Social History   Tobacco Use  . Smoking status: Unknown If Ever Smoked  Substance Use Topics  . Alcohol use: Not on file  . Drug use: Not on file    Home Medications Prior to Admission medications   Medication Sig Start Date End Date Taking? Authorizing Provider  acetaminophen (TYLENOL) 500 MG tablet Take 500 mg by mouth daily.    [provider]  amLODipine (NORVASC) 10 MG tablet Take 10 mg by mouth daily.    [provider]  donepezil (ARICEPT) 10 MG tablet Take 10 mg by mouth daily.    [provider]  levothyroxine (SYNTHROID) 175 MCG tablet Take 175 mcg by mouth daily before breakfast.    [provider]  lisinopril (ZESTRIL) 20 MG tablet Take 20 mg by mouth daily.    [provider]  LORazepam (ATIVAN) 0.5 MG tablet Take 0.5 mg by mouth See admin instructions. In the morning and once daily as needed for anxiety or agitation    [provider]  melatonin 3 MG TABS tablet Take 3 mg by mouth at bedtime.    [provider]  memantine (NAMENDA) 10 MG tablet Take 10 mg by mouth 2 (two) times daily.    [provider]    Allergies    Patient has no known allergies.  Review of Systems   Review of Systems  Unable to perform ROS: Dementia    Physical Exam Updated Vital Signs BP 134/65 (BP Location: Left Arm)   Pulse 62   Temp 98 F (36.7 C) (Oral)   Resp 18   Ht 5\' 6"  (1.676 m)   Wt 53 kg   SpO2 93%   BMI 18.86 kg/m   Physical Exam Vitals and nursing note reviewed.  Constitutional:      General: She is not in acute distress.    Appearance: She is well-developed. She is not diaphoretic.  HENT:     Head: Normocephalic.     Comments: There is a 1.5 cm laceration just above the lateral aspect of the right eyebrow. Cardiovascular:     Rate and Rhythm: Normal rate and regular rhythm.     Heart sounds: No murmur heard.  No friction rub. No gallop.   Pulmonary:     Effort: Pulmonary effort is normal. No respiratory distress.     Breath sounds:  Normal breath sounds. No wheezing.  Abdominal:     General: Bowel sounds are normal. There is no distension.     Palpations: Abdomen is soft.     Tenderness: There is no abdominal tenderness.  Musculoskeletal:        General: Normal range of motion.     Cervical back: Normal range of motion and neck supple.  Skin:    General: Skin is warm and dry.  Neurological:     Mental Status: She is alert.     Comments: Patient awake and alert.  She is difficult to assess secondary to her dementia and confusion.  She does move all extremities.     ED Results / Procedures / Treatments   Labs (all labs ordered are listed, but only abnormal results are displayed) Labs Reviewed - No data to display  EKG None  Radiology No results found.  Procedures Procedures (including critical care time)  Medications Ordered in ED Medications - No data to  display  ED Course  I have reviewed the triage vital signs and the nursing notes.  Pertinent labs & imaging results that were available during my care of the patient were reviewed by me and considered in my medical decision making (see chart for details).    MDM Rules/Calculators/A&P  Patient brought by EMS after a fall at her skilled nursing facility.  Patient with history of Alzheimer's dementia and unable to offer any additional history.  Patient has a small laceration to the lateral aspect of the right eyebrow.  This was repaired with Dermabond as below.  From what I can tell, patient appears at her neurologic baseline.  She is moving all extremities and there are no obvious injuries otherwise.  CT scan of the head is unremarkable and CT scan of the cervical spine is negative for fracture.  Patient seems appropriate for discharge with as needed return.  LACERATION REPAIR Performed by: Geoffery Lyons Authorized by: Geoffery Lyons Consent: Verbal consent obtained. Risks and benefits: risks, benefits and alternatives were discussed Consent given by: patient Patient identity confirmed: provided demographic data Prepped and Draped in normal sterile fashion Wound explored  Laceration Location: right eyebrow  Laceration Length: 1.5cm  No Foreign Bodies seen or palpated  Anesthesia: local infiltration  Local anesthetic: none  Anesthetic total: none  Irrigation method: syringe Amount of cleaning: standard  Skin closure: dermabond  Number of sutures: dermabond  Technique: dermabond  Patient tolerance: Patient tolerated the procedure well with no immediate complications.   Final Clinical Impression(s) / ED Diagnoses Final diagnoses:  None    Rx / DC Orders ED Discharge Orders    None       Geoffery Lyons, MD 06/01/20 [phone]

## 2020-06-01 NOTE — ED Triage Notes (Signed)
Pt arrived via ems from [facility]. Pt has a history of alzheimers and is non-verbal. Pt fell and has a laceration on right side of head. No loc . Alert in tx area.

## 2020-06-01 NOTE — ED Notes (Signed)
Called EMS sceduler to transfer Pt back to [facility].

## 2020-06-01 NOTE — ED Notes (Signed)
dermabond to laceration

## 2020-06-01 NOTE — ED Provider Notes (Signed)
Signout from Dr. Judd Lien.  79 year old female dementia coming from nursing facility where she had a witnessed fall and struck her head.  Eyebrow laceration repaired.  She is pending results of CT imaging of head and cervical spine. Physical Exam  BP 134/65 (BP Location: Left Arm)   Pulse 62   Temp 98 F (36.7 C) (Oral)   Resp 18   Ht 5\' 6"  (1.676 m)   Wt 53 kg   SpO2 93%   BMI 18.86 kg/m   Physical Exam  ED Course/Procedures     Procedures  MDM  CT head and cervical spine read by radiology.  They identified no acute traumatic issues.  They do comment upon a trace subdural hygroma new since April.  No acute bleed.       May, MD 06/01/20 [phone]

## 2020-06-01 NOTE — ED Notes (Signed)
To CT

## 2021-06-10 ENCOUNTER — Emergency Department (HOSPITAL_COMMUNITY): Payer: Medicare Other

## 2021-06-10 ENCOUNTER — Encounter (HOSPITAL_COMMUNITY): Payer: Self-pay | Admitting: Emergency Medicine

## 2021-06-10 ENCOUNTER — Emergency Department (HOSPITAL_COMMUNITY)
Admission: EM | Admit: 2021-06-10 | Discharge: 2021-06-11 | Disposition: A | Discharge status: Home / Self Care | Payer: Medicare Other | Attending: Emergency Medicine | Admitting: Emergency Medicine

## 2021-06-10 ENCOUNTER — Other Ambulatory Visit: Payer: Self-pay

## 2021-06-10 DIAGNOSIS — E039 Hypothyroidism, unspecified: Secondary | ICD-10-CM | POA: Diagnosis not present

## 2021-06-10 DIAGNOSIS — R4182 Altered mental status, unspecified: Secondary | ICD-10-CM | POA: Insufficient documentation

## 2021-06-10 DIAGNOSIS — G309 Alzheimer's disease, unspecified: Secondary | ICD-10-CM | POA: Insufficient documentation

## 2021-06-10 DIAGNOSIS — Z79899 Other long term (current) drug therapy: Secondary | ICD-10-CM | POA: Insufficient documentation

## 2021-06-10 NOTE — ED Triage Notes (Signed)
Pt from [facility] with AMS. Per staff, pt "acting strange, hunching over, and had strong odor of urine". Pt with hx dementia. Per staff, think pt may have UTI.

## 2021-06-10 NOTE — ED Provider Notes (Signed)
[facility] EMERGENCY DEPARTMENT Provider Note   CSN: [id number] Arrival date & time: 06/10/21  2205     History Chief Complaint  Patient presents with   Altered Mental Status    Natalie Stevenson is a 80 y.o. female.   Altered Mental Status  This patient is a 80 year old female, history of dementia, level 5 caveat applies.  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  The patient is currently living at [facility] assisted living, she is in a wheelchair but is able to stand and pivot.  She arrives by ambulance transport after it was called out that she was having some altered mental status, this was described by the long-term care facility as rocking back and forth and seeming to be hunched over and moaning occasionally.  According to the medical record that accompanies the patient she has essential hypertension, she takes amlodipine, she takes acetaminophen, she takes medications for her dementia as well as levothyroxine lisinopril and she takes lorazepam every morning as needed.  She is also been prescribed melatonin, diclofenac gel and uses polyethylene glycol as needed for bowel movements.  No new medications in the patient's regimen, she is not able to give me any information as she is just mumbling words and not answering my questions.  Paramedics were not able to give me any other information either except that her vital signs were unremarkable.  Past Medical History:  Diagnosis Date   Alzheimer's dementia (HCC)    Lumbago with sciatica    Metabolic encephalopathy    Thyroid disease    hypothyroidism   Tinea pedis     There are no problems to display for this patient.   No past surgical history on file.   OB History   No obstetric history on file.     No family history on file.  Social History   Tobacco Use   Smoking status: Unknown    Home Medications Prior to Admission medications    Medication Sig Start Date End Date Taking? Authorizing Provider  acetaminophen (TYLENOL) 500 MG tablet Take 500 mg by mouth daily.    [provider]  amLODipine (NORVASC) 10 MG tablet Take 10 mg by mouth daily.    [provider]  donepezil (ARICEPT) 10 MG tablet Take 10 mg by mouth daily.    [provider]  levothyroxine (SYNTHROID) 175 MCG tablet Take 175 mcg by mouth daily before breakfast.    [provider]  lisinopril (ZESTRIL) 20 MG tablet Take 20 mg by mouth daily.    [provider]  LORazepam (ATIVAN) 0.5 MG tablet Take 0.5 mg by mouth See admin instructions. In the morning and once daily as needed for anxiety or agitation    [provider]  melatonin 3 MG TABS tablet Take 3 mg by mouth at bedtime.    [provider]  memantine (NAMENDA) 10 MG tablet Take 10 mg by mouth 2 (two) times daily.    [provider]    Allergies    Patient has no known allergies.  Review of Systems   Review of Systems  Unable to perform ROS: Dementia   Physical Exam Updated Vital Signs Ht 1.676 m (5\' 6" )   Wt 53 kg   BMI 18.86 kg/m   Physical Exam Vitals and nursing note reviewed.  Constitutional:      General: She is not in acute distress.    Appearance: She is  well-developed.  HENT:     Head: Normocephalic and atraumatic.     Mouth/Throat:     Pharynx: No oropharyngeal exudate.  Eyes:     General: No scleral icterus.       Right eye: No discharge.        Left eye: No discharge.     Conjunctiva/sclera: Conjunctivae normal.     Pupils: Pupils are equal, round, and reactive to light.  Neck:     Thyroid: No thyromegaly.     Vascular: No JVD.  Cardiovascular:     Rate and Rhythm: Normal rate and regular rhythm.     Heart sounds: Normal heart sounds. No murmur heard.   No friction rub. No gallop.  Pulmonary:     Effort: Pulmonary effort is normal. No respiratory distress.     Breath sounds: Normal breath  sounds. No wheezing or rales.  Abdominal:     General: Bowel sounds are normal. There is no distension.     Palpations: Abdomen is soft. There is no mass.     Tenderness: There is no abdominal tenderness.  Musculoskeletal:        General: No tenderness. Normal range of motion.     Cervical back: Normal range of motion and neck supple.  Lymphadenopathy:     Cervical: No cervical adenopathy.  Skin:    General: Skin is warm and dry.     Findings: No erythema or rash.  Neurological:     Mental Status: She is alert.     Coordination: Coordination normal.     Comments: The patient is able to move all 4 extremities, she has a bit of a tremor with both of her arms that seems to go away when she has intentional movements.  She has no facial droop, she is able to say words but she does not seem to make sense when she answers my questions.  She has symmetric strength in her all 4 extremities, she is globally weak at about 4 out of 5 strength in all 4 extremities, she has a normal level of alertness  Psychiatric:        Behavior: Behavior normal.    ED Results / Procedures / Treatments   Labs (all labs ordered are listed, but only abnormal results are displayed) Labs Reviewed - No data to display  EKG None  Radiology No results found.  Procedures Procedures   Medications Ordered in ED Medications - No data to display  ED Course  I have reviewed the triage vital signs and the nursing notes.  Pertinent labs & imaging results that were available during my care of the patient were reviewed by me and considered in my medical decision making (see chart for details).    MDM Rules/Calculators/A&P                           Altered mental status not otherwise specified, there is some element of metabolic encephalopathy here most likely, will check for infectious causes such as urinary tract infection, get a chest x-ray EKG and some labs, the patient does not appear to be in distress does  not appear febrile or significantly tachycardic and has normal pulses  At the change of shift care was signed out to the oncoming physician Dr. Judd Lien to follow-up results and disposition accordingly  Final Clinical Impression(s) / ED Diagnoses Final diagnoses:  None    Rx / DC Orders ED Discharge Orders  None        Eber Hong, MD 06/11/21 1504

## 2021-06-11 LAB — CBC
HCT: 39.3 % (ref 36.0–46.0)
Hemoglobin: 12.2 g/dL (ref 12.0–15.0)
MCH: 30.9 pg (ref 26.0–34.0)
MCHC: 31 g/dL (ref 30.0–36.0)
MCV: 99.5 fL (ref 80.0–100.0)
Platelets: 140 10*3/uL — ABNORMAL LOW (ref 150–400)
RBC: 3.95 MIL/uL (ref 3.87–5.11)
RDW: 12.7 % (ref 11.5–15.5)
WBC: 6.2 10*3/uL (ref 4.0–10.5)
nRBC: 0 % (ref 0.0–0.2)

## 2021-06-11 LAB — COMPREHENSIVE METABOLIC PANEL
ALT: 27 U/L (ref 0–44)
AST: 34 U/L (ref 15–41)
Albumin: 3.7 g/dL (ref 3.5–5.0)
Alkaline Phosphatase: 95 U/L (ref 38–126)
Anion gap: 5 (ref 5–15)
BUN: 18 mg/dL (ref 8–23)
CO2: 30 mmol/L (ref 22–32)
Calcium: 9.1 mg/dL (ref 8.9–10.3)
Chloride: 111 mmol/L (ref 98–111)
Creatinine, Ser: 0.67 mg/dL (ref 0.44–1.00)
GFR, Estimated: >60 mL/min (ref >60)
Glucose, Bld: 118 mg/dL — ABNORMAL HIGH (ref 70–99)
Potassium: 3.3 mmol/L — ABNORMAL LOW (ref 3.5–5.1)
Sodium: 146 mmol/L — ABNORMAL HIGH (ref 135–145)
Total Bilirubin: 0.6 mg/dL (ref 0.3–1.2)
Total Protein: 7.1 g/dL (ref 6.5–8.1)

## 2021-06-11 LAB — URINALYSIS, ROUTINE W REFLEX MICROSCOPIC
Bacteria, UA: NONE SEEN
Bilirubin Urine: NEGATIVE
Glucose, UA: 50 mg/dL — AB
Ketones, ur: NEGATIVE mg/dL
Leukocytes,Ua: NEGATIVE
Nitrite: NEGATIVE
Protein, ur: NEGATIVE mg/dL
Specific Gravity, Urine: 1.019 (ref 1.005–1.030)
pH: 7 (ref 5.0–8.0)

## 2021-06-11 LAB — MAGNESIUM: Magnesium: 2.2 mg/dL (ref 1.7–2.4)

## 2021-06-11 LAB — AMMONIA: Ammonia: 9 umol/L (ref 9–35)

## 2021-06-11 NOTE — Discharge Instructions (Addendum)
Continue medications as before.  Follow-up with primary doctor this week.

## 2021-06-11 NOTE — ED Notes (Signed)
EMS called for transport.

## 2021-06-11 NOTE — ED Provider Notes (Signed)
  Physical Exam  BP (!) 184/99   Pulse 77   Resp 19   Ht 5\' 6"  (1.676 m)   Wt 53 kg   SpO2 97%   BMI 18.86 kg/m   Physical Exam Vitals and nursing note reviewed.  Constitutional:      General: She is not in acute distress.    Appearance: She is well-developed. She is not diaphoretic.  HENT:     Head: Normocephalic and atraumatic.  Cardiovascular:     Rate and Rhythm: Normal rate and regular rhythm.     Heart sounds: No murmur heard.   No friction rub. No gallop.  Pulmonary:     Effort: Pulmonary effort is normal. No respiratory distress.     Breath sounds: Normal breath sounds. No wheezing.  Abdominal:     General: Bowel sounds are normal. There is no distension.     Palpations: Abdomen is soft.     Tenderness: no abdominal tenderness  Musculoskeletal:        General: Normal range of motion.     Cervical back: Normal range of motion and neck supple.  Skin:    General: Skin is warm and dry.  Neurological:     General: No focal deficit present.     Mental Status: She is alert.     Comments: Patient is awake and alert and in no distress.  She either cannot or refuses to speak with me.  She moves all extremities and appears in no distress.    ED Course/Procedures     Procedures  MDM  Care assumed from Dr. at shift change.  Care signed out awaiting results of laboratory studies and urinalysis.  These have returned and are unremarkable.  At this point, I see no indication for the patient to be admitted and feels as though she can safely be returned to her extended care facility.       Hyacinth Meeker, MD 06/11/21 [phone]

## 2021-08-09 ENCOUNTER — Encounter (HOSPITAL_COMMUNITY): Payer: Self-pay

## 2021-08-09 ENCOUNTER — Emergency Department (HOSPITAL_COMMUNITY): Payer: Medicare Other

## 2021-08-09 ENCOUNTER — Emergency Department (HOSPITAL_COMMUNITY)
Admission: EM | Admit: 2021-08-09 | Discharge: 2021-08-09 | Disposition: A | Discharge status: Home / Self Care | Payer: Medicare Other | Attending: Emergency Medicine | Admitting: Emergency Medicine

## 2021-08-09 ENCOUNTER — Other Ambulatory Visit: Payer: Self-pay

## 2021-08-09 DIAGNOSIS — F0280 Dementia in other diseases classified elsewhere without behavioral disturbance: Secondary | ICD-10-CM | POA: Insufficient documentation

## 2021-08-09 DIAGNOSIS — Z79899 Other long term (current) drug therapy: Secondary | ICD-10-CM | POA: Diagnosis not present

## 2021-08-09 DIAGNOSIS — G309 Alzheimer's disease, unspecified: Secondary | ICD-10-CM | POA: Diagnosis not present

## 2021-08-09 DIAGNOSIS — E039 Hypothyroidism, unspecified: Secondary | ICD-10-CM | POA: Insufficient documentation

## 2021-08-09 DIAGNOSIS — W050XXA Fall from non-moving wheelchair, initial encounter: Secondary | ICD-10-CM | POA: Diagnosis not present

## 2021-08-09 DIAGNOSIS — S0990XA Unspecified injury of head, initial encounter: Secondary | ICD-10-CM | POA: Diagnosis present

## 2021-08-09 DIAGNOSIS — W19XXXA Unspecified fall, initial encounter: Secondary | ICD-10-CM

## 2021-08-09 DIAGNOSIS — S0083XA Contusion of other part of head, initial encounter: Secondary | ICD-10-CM | POA: Insufficient documentation

## 2021-08-09 NOTE — Discharge Instructions (Addendum)
You were seen in the emergency department for evaluation of injuries from a fall.  You had a CAT scan of your head and neck that did not show any acute traumatic findings.  You should use ice to the affected area and Tylenol for pain.  Follow-up with your doctor.  Return to the emergency department if any worsening or concerning symptoms

## 2021-08-09 NOTE — ED Triage Notes (Signed)
Pt brought to ED via [facility] from [facility] for fall from wheelchair and hit her head this am, denies LOC, denies blood thinners.

## 2021-08-09 NOTE — ED Provider Notes (Signed)
[facility] EMERGENCY DEPARTMENT Provider Note   CSN: [id number] Arrival date & time: 08/09/21  1011     History No chief complaint on file.   Natalie Stevenson is a 80 y.o. female.  Level 5 caveat secondary to dementia.  Patient from the facility was witnessed by another resident to have fallen out of her wheelchair and striking her head.  No reported loss of consciousness.  Not on blood thinners.  Patient denies any complaints.  The history is provided by the EMS personnel.  Fall This is a new problem. The current episode started 1 to 2 hours ago. The problem has not changed since onset.Nothing aggravates the symptoms. Nothing relieves the symptoms. She has tried nothing for the symptoms. The treatment provided no relief.      Past Medical History:  Diagnosis Date   Alzheimer's dementia (HCC)    Lumbago with sciatica    Metabolic encephalopathy    Thyroid disease    hypothyroidism   Tinea pedis     There are no problems to display for this patient.   No past surgical history on file.   OB History   No obstetric history on file.     No family history on file.  Social History   Tobacco Use   Smoking status: Unknown    Home Medications Prior to Admission medications   Medication Sig Start Date End Date Taking? Authorizing Provider  acetaminophen (TYLENOL) 500 MG tablet Take 500 mg by mouth daily.    [provider]  amLODipine (NORVASC) 10 MG tablet Take 10 mg by mouth daily.    [provider]  donepezil (ARICEPT) 10 MG tablet Take 10 mg by mouth daily.    [provider]  levothyroxine (SYNTHROID) 175 MCG tablet Take 175 mcg by mouth daily before breakfast.    [provider]  lisinopril (ZESTRIL) 20 MG tablet Take 20 mg by mouth daily.    [provider]  LORazepam (ATIVAN) 0.5 MG tablet Take 0.5 mg by mouth See admin instructions. In the morning and once daily as needed for anxiety or agitation    [provider]  melatonin 3 MG TABS tablet Take 3 mg by mouth at bedtime.    [provider]  memantine (NAMENDA) 10 MG tablet Take 10 mg by mouth 2 (two) times daily.    [provider]    Allergies    Patient has no known allergies.  Review of Systems   Review of Systems  Unable to perform ROS: Dementia   Physical Exam Updated Vital Signs BP (!) 145/63   Pulse 66   Temp 98.4 F (36.9 C) (Oral)   Resp 19   Ht 5\' 5"  (1.651 m)   Wt 49.9 kg   SpO2 100%   BMI 18.30 kg/m   Physical Exam Vitals and nursing note reviewed.  Constitutional:      General: She is not in acute distress.    Appearance: Normal appearance. She is well-developed.  HENT:     Head: Normocephalic.     Comments: Patient has approximately 4 cm hematoma to her upper left forehead.  No open wounds. Eyes:     Conjunctiva/sclera: Conjunctivae normal.  Neck:     Comments: Patient in cervical collar, trach midline no step-offs Cardiovascular:     Rate and Rhythm: Normal rate and regular rhythm.     Heart sounds: No murmur heard. Pulmonary:     Effort: Pulmonary effort is normal. No respiratory  distress.     Breath sounds: Normal breath sounds.  Abdominal:     Palpations: Abdomen is soft.     Tenderness: There is no abdominal tenderness.  Musculoskeletal:        General: No deformity or signs of injury. Normal range of motion.  Skin:    General: Skin is warm and dry.  Neurological:     General: No focal deficit present.     Mental Status: She is alert. Mental status is at baseline. She is disoriented.    ED Results / Procedures / Treatments   Labs (all labs ordered are listed, but only abnormal results are displayed) Labs Reviewed - No data to display  EKG None  Radiology CT Head Wo Contrast  Result Date: 08/09/2021 CLINICAL DATA:  Larey Seat from wheelchair EXAM: CT HEAD WITHOUT CONTRAST CT CERVICAL SPINE WITHOUT CONTRAST TECHNIQUE: Multidetector CT imaging of the head and  cervical spine was performed following the standard protocol without intravenous contrast. Multiplanar CT image reconstructions of the cervical spine were also generated. COMPARISON:  06/01/2020 CT head and cervical spine. FINDINGS: CT HEAD FINDINGS Brain: No acute infarction, hemorrhage, hydrocephalus, mass, mass effect, or midline shift. Previously noted low-density collection along the left frontal convexity is no longer seen. Slightly increased size of the ventricles, which appear commensurate with degree of cerebral atrophy. Vascular: No hyperdense vessel. Skull: No acute fracture. Sinuses/Orbits: Status post right lens replacement. The sinuses are unremarkable. Other: The mastoids are well aerated. CT CERVICAL SPINE FINDINGS Alignment: Mild straightening of the normal cervical lordosis, which may be positional. No significant listhesis. Skull base and vertebrae: No acute fracture. No primary bone lesion or focal pathologic process. Soft tissues and spinal canal: No prevertebral fluid or swelling. No visible canal hematoma. Disc levels:  Well preserved for age. Upper chest: No focal pulmonary opacity. Other: None. IMPRESSION: 1. No acute intracranial process. 2. No acute fracture or static listhesis in the cervical spine. 3. Interval resolution of previously noted trace left frontal hygroma. Electronically Signed   By: Wiliam Ke M.D.   On: 08/09/2021 11:22   CT Cervical Spine Wo Contrast  Result Date: 08/09/2021 CLINICAL DATA:  Larey Seat from wheelchair EXAM: CT HEAD WITHOUT CONTRAST CT CERVICAL SPINE WITHOUT CONTRAST TECHNIQUE: Multidetector CT imaging of the head and cervical spine was performed following the standard protocol without intravenous contrast. Multiplanar CT image reconstructions of the cervical spine were also generated. COMPARISON:  06/01/2020 CT head and cervical spine. FINDINGS: CT HEAD FINDINGS Brain: No acute infarction, hemorrhage, hydrocephalus, mass, mass effect, or midline shift.  Previously noted low-density collection along the left frontal convexity is no longer seen. Slightly increased size of the ventricles, which appear commensurate with degree of cerebral atrophy. Vascular: No hyperdense vessel. Skull: No acute fracture. Sinuses/Orbits: Status post right lens replacement. The sinuses are unremarkable. Other: The mastoids are well aerated. CT CERVICAL SPINE FINDINGS Alignment: Mild straightening of the normal cervical lordosis, which may be positional. No significant listhesis. Skull base and vertebrae: No acute fracture. No primary bone lesion or focal pathologic process. Soft tissues and spinal canal: No prevertebral fluid or swelling. No visible canal hematoma. Disc levels:  Well preserved for age. Upper chest: No focal pulmonary opacity. Other: None. IMPRESSION: 1. No acute intracranial process. 2. No acute fracture or static listhesis in the cervical spine. 3. Interval resolution of previously noted trace left frontal hygroma. Electronically Signed   By: Wiliam Ke M.D.   On: 08/09/2021 11:22    Procedures Procedures  Medications Ordered in ED Medications - No data to display  ED Course  I have reviewed the triage vital signs and the nursing notes.  Pertinent labs & imaging results that were available during my care of the patient were reviewed by me and considered in my medical decision making (see chart for details).  Clinical Course as of 08/09/21 1908  Wed Aug 09, 2021  1127 CT imaging without any acute findings.  We will have return to facility. [MB]    Clinical Course User Index [MB] Terrilee Files, MD   MDM Rules/Calculators/A&P                          80 year old female here for evaluation of witnessed fall injury to head.  CT head and cervical spine ordered and interpreted by me as no acute fractures.  At this time do not feel needs any further work-up.  Will return back to her facility where they can continue to observe her.  No family at  bedside update  Final Clinical Impression(s) / ED Diagnoses Final diagnoses:  Fall, initial encounter  Minor head injury, initial encounter    Rx / DC Orders ED Discharge Orders     None        Terrilee Files, MD 08/09/21 Windell Moment

## 2021-08-14 ENCOUNTER — Inpatient Hospital Stay (HOSPITAL_COMMUNITY)
Admission: EM | Admit: 2021-08-14 | Discharge: 2021-08-18 | DRG: 871 | Disposition: A | Discharge status: Hospice | Payer: Medicare Other | Attending: Internal Medicine | Admitting: Internal Medicine

## 2021-08-14 ENCOUNTER — Inpatient Hospital Stay (HOSPITAL_COMMUNITY): Payer: Medicare Other

## 2021-08-14 ENCOUNTER — Emergency Department (HOSPITAL_COMMUNITY): Payer: Medicare Other

## 2021-08-14 ENCOUNTER — Encounter (HOSPITAL_COMMUNITY): Payer: Self-pay | Admitting: Emergency Medicine

## 2021-08-14 ENCOUNTER — Other Ambulatory Visit: Payer: Self-pay

## 2021-08-14 DIAGNOSIS — N390 Urinary tract infection, site not specified: Secondary | ICD-10-CM | POA: Diagnosis present

## 2021-08-14 DIAGNOSIS — L89156 Pressure-induced deep tissue damage of sacral region: Secondary | ICD-10-CM | POA: Diagnosis present

## 2021-08-14 DIAGNOSIS — E46 Unspecified protein-calorie malnutrition: Secondary | ICD-10-CM | POA: Diagnosis present

## 2021-08-14 DIAGNOSIS — E039 Hypothyroidism, unspecified: Secondary | ICD-10-CM | POA: Diagnosis present

## 2021-08-14 DIAGNOSIS — B965 Pseudomonas (aeruginosa) (mallei) (pseudomallei) as the cause of diseases classified elsewhere: Secondary | ICD-10-CM | POA: Diagnosis present

## 2021-08-14 DIAGNOSIS — Z681 Body mass index (BMI) 19 or less, adult: Secondary | ICD-10-CM | POA: Diagnosis not present

## 2021-08-14 DIAGNOSIS — R778 Other specified abnormalities of plasma proteins: Secondary | ICD-10-CM | POA: Diagnosis not present

## 2021-08-14 DIAGNOSIS — N179 Acute kidney failure, unspecified: Secondary | ICD-10-CM

## 2021-08-14 DIAGNOSIS — I10 Essential (primary) hypertension: Secondary | ICD-10-CM | POA: Diagnosis present

## 2021-08-14 DIAGNOSIS — B952 Enterococcus as the cause of diseases classified elsewhere: Secondary | ICD-10-CM | POA: Diagnosis present

## 2021-08-14 DIAGNOSIS — Z66 Do not resuscitate: Secondary | ICD-10-CM | POA: Diagnosis present

## 2021-08-14 DIAGNOSIS — A419 Sepsis, unspecified organism: Principal | ICD-10-CM

## 2021-08-14 DIAGNOSIS — L89226 Pressure-induced deep tissue damage of left hip: Secondary | ICD-10-CM | POA: Diagnosis present

## 2021-08-14 DIAGNOSIS — R296 Repeated falls: Secondary | ICD-10-CM | POA: Diagnosis present

## 2021-08-14 DIAGNOSIS — R6521 Severe sepsis with septic shock: Secondary | ICD-10-CM | POA: Diagnosis present

## 2021-08-14 DIAGNOSIS — K7200 Acute and subacute hepatic failure without coma: Secondary | ICD-10-CM | POA: Diagnosis present

## 2021-08-14 DIAGNOSIS — G9341 Metabolic encephalopathy: Secondary | ICD-10-CM | POA: Diagnosis present

## 2021-08-14 DIAGNOSIS — J189 Pneumonia, unspecified organism: Secondary | ICD-10-CM | POA: Diagnosis present

## 2021-08-14 DIAGNOSIS — I214 Non-ST elevation (NSTEMI) myocardial infarction: Secondary | ICD-10-CM

## 2021-08-14 DIAGNOSIS — E860 Dehydration: Secondary | ICD-10-CM | POA: Diagnosis present

## 2021-08-14 DIAGNOSIS — Z7189 Other specified counseling: Secondary | ICD-10-CM | POA: Diagnosis not present

## 2021-08-14 DIAGNOSIS — L89516 Pressure-induced deep tissue damage of right ankle: Secondary | ICD-10-CM | POA: Diagnosis present

## 2021-08-14 DIAGNOSIS — R64 Cachexia: Secondary | ICD-10-CM | POA: Diagnosis present

## 2021-08-14 DIAGNOSIS — F0390 Unspecified dementia without behavioral disturbance: Secondary | ICD-10-CM | POA: Diagnosis present

## 2021-08-14 DIAGNOSIS — Z7401 Bed confinement status: Secondary | ICD-10-CM

## 2021-08-14 DIAGNOSIS — R748 Abnormal levels of other serum enzymes: Secondary | ICD-10-CM | POA: Diagnosis not present

## 2021-08-14 DIAGNOSIS — Z515 Encounter for palliative care: Secondary | ICD-10-CM | POA: Diagnosis not present

## 2021-08-14 DIAGNOSIS — R7989 Other specified abnormal findings of blood chemistry: Secondary | ICD-10-CM | POA: Diagnosis present

## 2021-08-14 DIAGNOSIS — Z79899 Other long term (current) drug therapy: Secondary | ICD-10-CM

## 2021-08-14 DIAGNOSIS — F0280 Dementia in other diseases classified elsewhere without behavioral disturbance: Secondary | ICD-10-CM | POA: Diagnosis present

## 2021-08-14 DIAGNOSIS — R6520 Severe sepsis without septic shock: Secondary | ICD-10-CM

## 2021-08-14 DIAGNOSIS — L89526 Pressure-induced deep tissue damage of left ankle: Secondary | ICD-10-CM | POA: Diagnosis present

## 2021-08-14 DIAGNOSIS — E870 Hyperosmolality and hypernatremia: Secondary | ICD-10-CM | POA: Diagnosis present

## 2021-08-14 DIAGNOSIS — Z79890 Hormone replacement therapy (postmenopausal): Secondary | ICD-10-CM

## 2021-08-14 DIAGNOSIS — E876 Hypokalemia: Secondary | ICD-10-CM | POA: Diagnosis present

## 2021-08-14 DIAGNOSIS — G309 Alzheimer's disease, unspecified: Secondary | ICD-10-CM | POA: Diagnosis present

## 2021-08-14 DIAGNOSIS — I21A1 Myocardial infarction type 2: Secondary | ICD-10-CM | POA: Diagnosis present

## 2021-08-14 DIAGNOSIS — R410 Disorientation, unspecified: Secondary | ICD-10-CM

## 2021-08-14 DIAGNOSIS — Z993 Dependence on wheelchair: Secondary | ICD-10-CM

## 2021-08-14 LAB — CBC WITH DIFFERENTIAL/PLATELET
Abs Immature Granulocytes: 0.28 10*3/uL — ABNORMAL HIGH (ref 0.00–0.07)
Basophils Absolute: 0 10*3/uL (ref 0.0–0.1)
Basophils Relative: 0 %
Eosinophils Absolute: 0 10*3/uL (ref 0.0–0.5)
Eosinophils Relative: 0 %
HCT: 40.1 % (ref 36.0–46.0)
Hemoglobin: 12.1 g/dL (ref 12.0–15.0)
Immature Granulocytes: 2 %
Lymphocytes Relative: 4 %
Lymphs Abs: 0.5 10*3/uL — ABNORMAL LOW (ref 0.7–4.0)
MCH: 31.3 pg (ref 26.0–34.0)
MCHC: 30.2 g/dL (ref 30.0–36.0)
MCV: 103.9 fL — ABNORMAL HIGH (ref 80.0–100.0)
Monocytes Absolute: 0.4 10*3/uL (ref 0.1–1.0)
Monocytes Relative: 3 %
Neutro Abs: 14.1 10*3/uL — ABNORMAL HIGH (ref 1.7–7.7)
Neutrophils Relative %: 91 %
Platelets: 175 10*3/uL (ref 150–400)
RBC: 3.86 MIL/uL — ABNORMAL LOW (ref 3.87–5.11)
RDW: 13.9 % (ref 11.5–15.5)
WBC: 15.3 10*3/uL — ABNORMAL HIGH (ref 4.0–10.5)
nRBC: 0 % (ref 0.0–0.2)

## 2021-08-14 LAB — URINALYSIS, MICROSCOPIC (REFLEX)

## 2021-08-14 LAB — BASIC METABOLIC PANEL
Anion gap: 13 (ref 5–15)
BUN: 45 mg/dL — ABNORMAL HIGH (ref 8–23)
CO2: 21 mmol/L — ABNORMAL LOW (ref 22–32)
Calcium: 7.9 mg/dL — ABNORMAL LOW (ref 8.9–10.3)
Chloride: 119 mmol/L — ABNORMAL HIGH (ref 98–111)
Creatinine, Ser: 1.79 mg/dL — ABNORMAL HIGH (ref 0.44–1.00)
GFR, Estimated: 28 mL/min — ABNORMAL LOW (ref >60)
Glucose, Bld: 222 mg/dL — ABNORMAL HIGH (ref 70–99)
Potassium: 4.3 mmol/L (ref 3.5–5.1)
Sodium: 153 mmol/L — ABNORMAL HIGH (ref 135–145)

## 2021-08-14 LAB — URINALYSIS, ROUTINE W REFLEX MICROSCOPIC
Bilirubin Urine: NEGATIVE
Glucose, UA: 100 mg/dL — AB
Ketones, ur: NEGATIVE mg/dL
Leukocytes,Ua: NEGATIVE
Nitrite: NEGATIVE
Protein, ur: 30 mg/dL — AB
Specific Gravity, Urine: 1.025 (ref 1.005–1.030)
pH: 5.5 (ref 5.0–8.0)

## 2021-08-14 LAB — LACTIC ACID, PLASMA
Lactic Acid, Venous: 9.1 mmol/L (ref 0.5–1.9)
Lactic Acid, Venous: 5.2 mmol/L (ref 0.5–1.9)
Lactic Acid, Venous: 5 mmol/L (ref 0.5–1.9)

## 2021-08-14 LAB — COMPREHENSIVE METABOLIC PANEL
ALT: 814 U/L — ABNORMAL HIGH (ref 0–44)
AST: 1068 U/L — ABNORMAL HIGH (ref 15–41)
Albumin: 3.4 g/dL — ABNORMAL LOW (ref 3.5–5.0)
Alkaline Phosphatase: 173 U/L — ABNORMAL HIGH (ref 38–126)
Anion gap: 18 — ABNORMAL HIGH (ref 5–15)
BUN: 44 mg/dL — ABNORMAL HIGH (ref 8–23)
CO2: 21 mmol/L — ABNORMAL LOW (ref 22–32)
Calcium: 9 mg/dL (ref 8.9–10.3)
Chloride: 115 mmol/L — ABNORMAL HIGH (ref 98–111)
Creatinine, Ser: 2.13 mg/dL — ABNORMAL HIGH (ref 0.44–1.00)
GFR, Estimated: 23 mL/min — ABNORMAL LOW (ref >60)
Glucose, Bld: 250 mg/dL — ABNORMAL HIGH (ref 70–99)
Potassium: 4.6 mmol/L (ref 3.5–5.1)
Sodium: 154 mmol/L — ABNORMAL HIGH (ref 135–145)
Total Bilirubin: 0.9 mg/dL (ref 0.3–1.2)
Total Protein: 7 g/dL (ref 6.5–8.1)

## 2021-08-14 LAB — TSH: TSH: 0.389 u[IU]/mL (ref 0.350–4.500)

## 2021-08-14 LAB — TROPONIN I (HIGH SENSITIVITY)
Troponin I (High Sensitivity): 3187 ng/L (ref <18)
Troponin I (High Sensitivity): 4973 ng/L (ref <18)
Troponin I (High Sensitivity): 6013 ng/L (ref <18)

## 2021-08-14 LAB — BLOOD GAS, ARTERIAL
Acid-base deficit: 4.3 mmol/L — ABNORMAL HIGH (ref 0.0–2.0)
Bicarbonate: 21 mmol/L (ref 20.0–28.0)
Drawn by: 41977
FIO2: 36
O2 Saturation: 98.4 %
Patient temperature: 35.6
pCO2 arterial: 34 mmHg (ref 32.0–48.0)
pH, Arterial: 7.384 (ref 7.350–7.450)
pO2, Arterial: 171 mmHg — ABNORMAL HIGH (ref 83.0–108.0)

## 2021-08-14 LAB — CBG MONITORING, ED: Glucose-Capillary: 115 mg/dL — ABNORMAL HIGH (ref 70–99)

## 2021-08-14 LAB — T4, FREE: Free T4: 1.61 ng/dL — ABNORMAL HIGH (ref 0.61–1.12)

## 2021-08-14 LAB — MRSA NEXT GEN BY PCR, NASAL: MRSA by PCR Next Gen: NOT DETECTED

## 2021-08-14 LAB — OSMOLALITY: Osmolality: 355 mOsm/kg (ref 275–295)

## 2021-08-14 LAB — PROCALCITONIN: Procalcitonin: 1.21 ng/mL

## 2021-08-14 MED ORDER — DEXTROSE-NACL 5-0.45 % IV SOLN: INTRAVENOUS | Status: AC

## 2021-08-14 MED ORDER — SODIUM CHLORIDE 0.9 % IV SOLN
1.0000 g | INTRAVENOUS | Status: DC
  Filled 2021-08-14: qty 1

## 2021-08-14 MED ORDER — LACTATED RINGERS IV BOLUS
500.0000 mL | Freq: Once | INTRAVENOUS | Status: AC
  Administered 2021-08-14: 500 mL via INTRAVENOUS

## 2021-08-14 MED ORDER — ONDANSETRON HCL 4 MG PO TABS: 4.0000 mg | ORAL_TABLET | Freq: Four times a day (QID) | ORAL | Status: DC | PRN

## 2021-08-14 MED ORDER — SODIUM CHLORIDE 0.45 % IV SOLN: INTRAVENOUS | Status: DC

## 2021-08-14 MED ORDER — ASPIRIN 300 MG RE SUPP
300.0000 mg | Freq: Once | RECTAL | Status: AC
  Administered 2021-08-14: 300 mg via RECTAL
  Filled 2021-08-14: qty 1

## 2021-08-14 MED ORDER — SODIUM CHLORIDE 0.9 % IV SOLN
2.0000 g | Freq: Once | INTRAVENOUS | Status: AC
  Administered 2021-08-14: 2 g via INTRAVENOUS
  Filled 2021-08-14: qty 20

## 2021-08-14 MED ORDER — LACTATED RINGERS IV SOLN: INTRAVENOUS | Status: DC

## 2021-08-14 MED ORDER — POLYETHYLENE GLYCOL 3350 17 G PO PACK: 17.0000 g | PACK | Freq: Every day | ORAL | Status: DC | PRN

## 2021-08-14 MED ORDER — ACETAMINOPHEN 650 MG RE SUPP: 650.0000 mg | Freq: Four times a day (QID) | RECTAL | Status: DC | PRN

## 2021-08-14 MED ORDER — HEPARIN BOLUS VIA INFUSION
3000.0000 [IU] | Freq: Once | INTRAVENOUS | Status: AC
  Administered 2021-08-14: 3000 [IU] via INTRAVENOUS

## 2021-08-14 MED ORDER — VANCOMYCIN VARIABLE DOSE PER UNSTABLE RENAL FUNCTION (PHARMACIST DOSING): Status: DC

## 2021-08-14 MED ORDER — VANCOMYCIN HCL 750 MG/150ML IV SOLN
750.0000 mg | Freq: Once | INTRAVENOUS | Status: AC
  Administered 2021-08-14: 750 mg via INTRAVENOUS
  Filled 2021-08-14: qty 150

## 2021-08-14 MED ORDER — METRONIDAZOLE 500 MG/100ML IV SOLN
500.0000 mg | Freq: Two times a day (BID) | INTRAVENOUS | Status: DC
  Administered 2021-08-14 – 2021-08-16 (×4): 500 mg via INTRAVENOUS
  Filled 2021-08-14 (×4): qty 100

## 2021-08-14 MED ORDER — LACTATED RINGERS IV BOLUS (SEPSIS)
1000.0000 mL | Freq: Once | INTRAVENOUS | Status: AC
  Administered 2021-08-14: 1000 mL via INTRAVENOUS

## 2021-08-14 MED ORDER — HEPARIN SODIUM (PORCINE) 5000 UNIT/ML IJ SOLN: 5000.0000 [IU] | Freq: Three times a day (TID) | INTRAMUSCULAR | Status: DC

## 2021-08-14 MED ORDER — ONDANSETRON HCL 4 MG/2ML IJ SOLN: 4.0000 mg | Freq: Four times a day (QID) | INTRAMUSCULAR | Status: DC | PRN

## 2021-08-14 MED ORDER — SODIUM CHLORIDE 0.9 % IV SOLN
2.0000 g | Freq: Once | INTRAVENOUS | Status: AC
  Administered 2021-08-14: 2 g via INTRAVENOUS
  Filled 2021-08-14: qty 2

## 2021-08-14 MED ORDER — SODIUM CHLORIDE 0.9 % IV SOLN: 2.0000 g | INTRAVENOUS | Status: DC

## 2021-08-14 MED ORDER — ASPIRIN 325 MG PO TABS: 325.0000 mg | ORAL_TABLET | Freq: Once | ORAL | Status: DC

## 2021-08-14 MED ORDER — ACETAMINOPHEN 325 MG PO TABS: 650.0000 mg | ORAL_TABLET | Freq: Four times a day (QID) | ORAL | Status: DC | PRN

## 2021-08-14 MED ORDER — HEPARIN (PORCINE) 25000 UT/250ML-% IV SOLN
700.0000 [IU]/h | INTRAVENOUS | Status: DC
  Administered 2021-08-14: 600 [IU]/h via INTRAVENOUS
  Filled 2021-08-14: qty 250

## 2021-08-14 MED ORDER — SODIUM CHLORIDE 0.9 % IV BOLUS
1000.0000 mL | Freq: Once | INTRAVENOUS | Status: AC
  Administered 2021-08-14: 1000 mL via INTRAVENOUS

## 2021-08-14 NOTE — ED Notes (Signed)
Unable to get O2 level on patient. Dr. Tanda Rockers made aware.

## 2021-08-14 NOTE — ED Triage Notes (Addendum)
Pt from [facility] via [facility] with c/o hypotension. Per facility pt was acting normal last night. Pt has been eating and drinking normally per facility.

## 2021-08-14 NOTE — H&P (Signed)
History and Physical    Natalie Stevenson AVW:[id number] DOB: 28-Dec-1940 DOA: 08/14/2021  PCP: Herma Mering, NP   Patient coming from: [facility]  I have personally briefly reviewed patient's old medical records in [facility] Link  Chief Complaint: AMS, hypotension.  HPI: Natalie Stevenson is a 80 y.o. female with medical history significant for Alzheimer's dementia, hypothyroidism.  Patient was brought to the ED from [facility] nursing home with reports of hypotension and change in behavior.  Patient was last seen normal yesterday.  At baseline patient is wheelchair/bedbound.  I called patient's niece who is patient's healthcare power of attorney, and patient's closest living relative, contact number [phone] [facility], who lives in [place].  She confirmed the above.  She reports at baseline, patient cannot hold a conversation and does not recognize family and does not interact.  On my evaluation patient appears lethargic, but arouses to touch, opens eyes, not verbalizing, not answering questions.  ED Course: Temp 96.1.  Heart rate 91-105.  EDP reports blood pressure initially in the 90s on arrival, but recorded 103 initially improved to 130  after 2L liters bolus.  WBC 15.3.  ABG shows pH of 7.3, PCO2 of 34, PO2 171.  Creatinine elevated 2.13.  Sodium 154 . Markedly elevated liver enzymes AST 1068, ALT 814, ALP 173, but normal total bilirubin 0.9.  UA with many bacteria.  Portable chest x-ray and head CT without acute abnormality.  Elevated troponin at 3187.  EDP talked to patient's niece who confirmed DNR status and does not want any heroic measures. IV ceftriaxone started for presumed UTI, heparin drip also started for elevated troponin.  Review of Systems: Unable to assess due to baseline dementia.  Past Medical History:  Diagnosis Date   Alzheimer's dementia (HCC)    Lumbago with sciatica    Metabolic encephalopathy    Thyroid disease    hypothyroidism   Tinea pedis      History reviewed. No pertinent surgical history.   has no history on file for tobacco use, alcohol use, and drug use.  No Known Allergies  Unable to ascertain due to baseline dementia.  Prior to Admission medications   Medication Sig Start Date End Date Taking? Authorizing Provider  acetaminophen (TYLENOL) 500 MG tablet Take 500 mg by mouth daily.   Yes [provider]  amLODipine (NORVASC) 10 MG tablet Take 10 mg by mouth daily.   Yes [provider]  diclofenac Sodium (VOLTAREN) 1 % GEL Apply 1 application topically 3 (three) times daily as needed for pain. 03/28/21  Yes [provider]  donepezil (ARICEPT) 10 MG tablet Take 10 mg by mouth daily.   Yes [provider]  lactose free nutrition (BOOST) LIQD Take 237 mLs by mouth 2 (two) times daily between meals.   Yes [provider]  levothyroxine (SYNTHROID) 200 MCG tablet Take 200 mcg by mouth daily before breakfast.   Yes [provider]  levothyroxine (SYNTHROID) 25 MCG tablet Take 25 mcg by mouth daily before breakfast.   Yes [provider]  lisinopril (ZESTRIL) 20 MG tablet Take 20 mg by mouth daily.   Yes [provider]  LORazepam (ATIVAN) 0.5 MG tablet Take 0.5 mg by mouth See admin instructions. In the morning and once daily as needed for anxiety or agitation   Yes [provider]  melatonin 3 MG TABS tablet Take 3 mg by mouth at bedtime.   Yes [provider]  memantine (NAMENDA) 10 MG tablet Take  10 mg by mouth daily.   Yes [provider]  polyethylene glycol (MIRALAX / GLYCOLAX) 17 g packet Take 17 g by mouth daily as needed for mild constipation.   Yes [provider]    Physical Exam: Limited by dementia and possibly altered mental status  Vitals:   08/14/21 1312 08/14/21 1400 08/14/21 1430 08/14/21 1500  BP: 104/69  126/71 133/67  Pulse: 91     Resp: 14   12  Temp:      TempSrc:      SpO2: 100%      Weight:  49.9 kg    Height:  5\' 5"  (1.651 m)      Constitutional: Acutely and chronically ill-appearing, lethargic, appears in thin and malnourished.   Vitals:   08/14/21 1312 08/14/21 1400 08/14/21 1430 08/14/21 1500  BP: 104/69  126/71 133/67  Pulse: 91     Resp: 14   12  Temp:      TempSrc:      SpO2: 100%     Weight:  49.9 kg    Height:  5\' 5"  (1.651 m)     Eyes: lids and conjunctivae normal ENMT: Mucous membranes appear  dry Neck: normal, supple, no masses, no thyromegaly Respiratory: clear to auscultation bilaterally, no wheezing, no crackles. Normal respiratory effort. No accessory muscle use.  Cardiovascular: Regular rate and rhythm, no murmurs / rubs / gallops. No extremity edema. 2+ pedal pulses.  Abdomen: Equivocal abdominal exam due to dementia, no appreciable tenderness, patient guarding, resisting exam, no masses palpated. No hepatosplenomegaly. Bowel sounds positive.  Musculoskeletal: no clubbing / cyanosis.  Patient resisting movement of her upper extremities unsure if this is purposeful or if from contractures. Skin: No ulcers on extremities trunk or sacral area, no rashes, lesions, ulcers. No induration Neurologic: Limited exam, due to advanced dementia, patient appears lethargic,  Psychiatric: Normal judgment and insight. Alert and oriented x 3. Normal mood.   Labs on Admission: I have personally reviewed following labs and imaging studies  CBC: Recent Labs  Lab 08/14/21 1252  WBC 15.3*  NEUTROABS 14.1*  HGB 12.1  HCT 40.1  MCV 103.9*  PLT 175   Basic Metabolic Panel: Recent Labs  Lab 08/14/21 1252  NA 154*  K 4.6  CL 115*  CO2 21*  GLUCOSE 250*  BUN 44*  CREATININE 2.13*  CALCIUM 9.0   Liver Function Tests: Recent Labs  Lab 08/14/21 1252  AST 1,068*  ALT 814*  ALKPHOS 173*  BILITOT 0.9  PROT 7.0  ALBUMIN 3.4*   CBG: Recent Labs  Lab 08/14/21 1238  GLUCAP 115*   Thyroid Function Tests: Recent Labs    08/14/21 1253  TSH  0.389   Urine analysis:    Component Value Date/Time   COLORURINE YELLOW 08/14/2021 1411   APPEARANCEUR HAZY (A) 08/14/2021 1411   APPEARANCEUR Clear 08/20/2013 0340   LABSPEC 1.025 08/14/2021 1411   LABSPEC 1.010 08/20/2013 0340   PHURINE 5.5 08/14/2021 1411   GLUCOSEU 100 (A) 08/14/2021 1411   GLUCOSEU Negative 08/20/2013 0340   HGBUR LARGE (A) 08/14/2021 1411   BILIRUBINUR NEGATIVE 08/14/2021 1411   BILIRUBINUR Negative 08/20/2013 0340   KETONESUR NEGATIVE 08/14/2021 1411   PROTEINUR 30 (A) 08/14/2021 1411   NITRITE NEGATIVE 08/14/2021 1411   LEUKOCYTESUR NEGATIVE 08/14/2021 1411   LEUKOCYTESUR Negative 08/20/2013 0340    Radiological Exams on Admission: CT Head Wo Contrast  Result Date: 08/14/2021 CLINICAL DATA:  Delirium.  Hypotension. EXAM: CT HEAD WITHOUT CONTRAST  TECHNIQUE: Contiguous axial images were obtained from the base of the skull through the vertex without intravenous contrast. COMPARISON:  Prior head CT examinations 08/09/2021 and earlier. FINDINGS: Brain: Moderate cerebral atrophy.  Comparatively mild cerebellar atrophy. Mild patchy and ill-defined hypoattenuation within the cerebral white matter, nonspecific but compatible chronic small vessel ischemic disease. There is no acute intracranial hemorrhage. No demarcated cortical infarct. No extra-axial fluid collection. No evidence of an intracranial mass. No midline shift. Vascular: No hyperdense vessel. Atherosclerotic calcifications. Skull: Normal. Negative for fracture or focal lesion. Sinuses/Orbits: Visualized orbits show no acute finding. No significant paranasal sinus disease at the imaged levels. IMPRESSION: No evidence of acute intracranial abnormality. Mild chronic small vessel ischemic changes within the cerebral white matter. Moderate cerebral atrophy.  Comparatively mild cerebellar atrophy. Electronically Signed   By: Jackey Loge D.O.   On: 08/14/2021 13:03   DG Chest Portable 1 View  Result Date:  08/14/2021 CLINICAL DATA:  Altered mental status EXAM: PORTABLE CHEST 1 VIEW COMPARISON:  06/10/2021 FINDINGS: Lungs are hyperinflated. Normal cardiac silhouette. No effusion, infiltrate or pneumothorax. No acute osseous abnormality. RIGHT nipple shadow noted. IMPRESSION: Hyperinflated lungs.  No acute findings. Electronically Signed   By: Genevive Bi M.D.   On: 08/14/2021 13:36    EKG: Independently reviewed.  Sinus rhythm rate 95, QTc 473.  PVCs present.  No significant change from prior.  Assessment/Plan Principal Problem:   Severe sepsis (HCC) Active Problems:   Dementia (HCC)   Hypothyroidism  Presumed Severe sepsis-with hypothermia of 96.1, initial tachycardia 105, initial tachypnea 22, leukocytosis 15.3 and significant lactic acidosis of 9.1 with endorgan dysfunction-shock liver, encephalopathy, AKI.  Presumed source- urinary with many bacteria in UA.Marland Kitchen  Chest x-ray clear.  No ulcers.  Patient appears malnourished and very dehydrated.  A large component of her lactic acidosis likely from dehydration and hypotension. -Broad-spectrum antibiotics with IV cefepime vancomycin and metronidazole. -Follow-up blood and urine cultures -Follow-up abdominal CT without contrast -Trend Lactic acid -Warming blanket - Check procalcitonin  Acute kidney injury-creatinine 2.13, baseline 0.6-1.  Likely prerenal from hypotension, and possibly sepsis in the setting of ACE inhibitor use. - LR 2.5 L bolus Given, continue 0.45 N/s + D5 100cc/hr x 20hrs -Follow-up CT rule out urinary tract obstruction -Hold lisinopril  Anion Gap metabolic acidosis-anion gap 8, serum bicarb 18.  Likely from severe lactic acidosis and acute kidney injury. -hydrate  Hypernatremia-sodium 154, likely from dehydration, poor oral intake. -Hydrate with half-normal saline, monitor sodium closely  Metabolic encephalopathy and advanced Alzheimer's dementia-.  Nursing home reports patient not as interactive compared to baseline.   At baseline she is wheelchair/bedbound, does not recognize family cannot hold conversation.  Head CT without acute abnormality.  Likely due to hypotension, dehydration and possibly sepsis. -Hydrate, antibiotics -N.p.o. for now -Hold donepezil, Namenda  Elevated troponin-3187 > 4173.  EKG without significant ST or T wave abnormalities.  No cardiac history.  With renal failure, advanced dementia, and likely etiology demand ischemia.  Patient would not be a candidate for any cardiac intervention.  -  I have explained to patient's niece likely etiology and differentials for elevated troponin, unexplained cardiac interventions would not be done here, or futility of transferring patient to [facility], she was agreed to admission here, and I confirmed DNR status and no heroic measures. -Trend troponin for now - heparin drip started in ED, will continue  Elevated liver enzymes-AST 1068, ALT 814, ALP 173, total bilirubin 0.9.  Likely shock liver from hypotension, and possibly sepsis.  Abdominal exam is equivocal. - CT Abdominal/pelvis without contrast -Trend in a.m -Pending clinical course consider GI evaluation if needed  Hypothyroidism-  -NPO For now, hold Synthroid  HTN-initial hypotension. -Hold lisinopril, Norvasc.  Due to the above documented multiple mobidities listed above, patient has a moderate to high chance of mortality during this admission.  CRITICAL CARE Performed by: Onnie Boer   Total critical care time: 70 minutes  Critical care time was exclusive of separately billable procedures and treating other patients.  Critical care was necessary to treat or prevent imminent or life-threatening deterioration.  Critical care was time spent personally by me on the following activities: development of treatment plan with patient and/or surrogate as well as nursing, discussions with consultants, evaluation of patient's response to treatment, examination of patient, obtaining history  from patient or surrogate, ordering and performing treatments and interventions, ordering and review of laboratory studies, ordering and review of radiographic studies, pulse oximetry and re-evaluation of patient's condition.    DVT prophylaxis: Heparin Code Status: DNR, confirmed with patient's niece Lavona Mound on the phone. Family Communication: I have explained severity of illness and prognosis to patient's niece who is her closest living relative and power of attorney - [phone] [facility]. Verbalized understanding, she lives in [place] and will be coming back to see patient. disposition Plan: > 2 days Consults called: None Admission status: Inpt, Step down I certify that at the point of admission it is my clinical judgment that the patient will require inpatient hospital care spanning beyond 2 midnights from the point of admission due to high intensity of service, high risk for further deterioration and high frequency of surveillance required. The following factors support the patient status of inpatient:    Onnie Boer MD [facility]  08/14/2021, 7:02 PM

## 2021-08-14 NOTE — Progress Notes (Signed)
ANTICOAGULATION CONSULT NOTE - Initial Consult  Pharmacy Consult for heparin Indication: chest pain/ACS  No Known Allergies  Patient Measurements: Height: 5\' 5"  (165.1 cm) Weight: 49.9 kg (110 lb 0.2 oz) IBW/kg (Calculated) : 57 HEPARIN DW (KG): 49.9   Vital Signs: Temp: 96.1 F (35.6 C) (10/17 1213) Temp Source: Axillary (10/17 1213) BP: 104/69 (10/17 1312) Pulse Rate: 91 (10/17 1312)  Labs: Recent Labs    08/14/21 1252  HGB 12.1  HCT 40.1  PLT 175  CREATININE 2.13*  TROPONINIHS 3,187*    Estimated Creatinine Clearance: 16 mL/min (A) (by C-G formula based on SCr of 2.13 mg/dL (H)).   Medical History: Past Medical History:  Diagnosis Date   Alzheimer's dementia (HCC)    Lumbago with sciatica    Metabolic encephalopathy    Thyroid disease    hypothyroidism   Tinea pedis     Medications:  See med rec  Assessment: Patient with elevated troponins. Pharmacy asked to dose heparin. Not on oral anticoagulants  Goal of Therapy:  Heparin level 0.3-0.7 units/ml Monitor platelets by anticoagulation protocol: Yes   Plan:  Give 3000 units bolus x 1 Start heparin infusion at 600 units/hr Check anti-Xa level in ~8 hours and daily while on heparin Continue to monitor H&H and platelets  08/16/21, BS Elder Cyphers, BCPS Clinical Pharmacist Pager [phone] 08/14/2021,2:44 PM

## 2021-08-14 NOTE — ED Notes (Signed)
Date and time results received: 08/14/21  (use smartphrase ".now" to insert current time)  Test: Troponin 4973 Critical Value: 4973  Name of Provider Notified: E Emokpae  Orders Received? Or Actions Taken?: None

## 2021-08-14 NOTE — Progress Notes (Signed)
Notified bedside nurse of need to draw repeat lactic acid. 

## 2021-08-14 NOTE — Progress Notes (Signed)
Called lab to draw repeat lactate

## 2021-08-14 NOTE — Progress Notes (Signed)
Date and time results received: 08/14/21 2130 (use smartphrase ".now" to insert current time)  Test: Troponin Critical Value: 6018  Name of Provider Notified: Zierle-Ghosh  Orders Received? Or Actions Taken?:  MD aware, no new orders at this time

## 2021-08-14 NOTE — Progress Notes (Signed)
Notified bedside nurse of need to draw repeat lactic acid  via phone call.

## 2021-08-14 NOTE — Progress Notes (Signed)
Pharmacy Antibiotic Note  Natalie Stevenson is a 80 y.o. female admitted on 08/14/2021 with concern for sepsis.  Pharmacy has been consulted for Vancomycin + Cefepime dosing along with Flagyl per MD.  The patient is noted to be in AKI - baseline appears to be 0.6-0.8, admit SCr 2.13 - now improved to 1.79, estimated CrCl hard to estimate with AKI and low body weight.   Plan: - Vanc 750 mg IV x 1 - No standing doses with AKI - f/u AM labs - Cefepime 2g IV x 1 followed by 1g IV every 24 hours - Will continue to follow renal function, culture results, LOT, and antibiotic de-escalation plans   Height: 5\' 5"  (165.1 cm) Weight: 35.6 kg (78 lb 7.7 oz) IBW/kg (Calculated) : 57  Temp (24hrs), Avg:97.1 F (36.2 C), Min:96.1 F (35.6 C), Max:98 F (36.7 C)  Recent Labs  Lab 08/14/21 1252 08/14/21 1557  WBC 15.3*  --   CREATININE 2.13* 1.79*  LATICACIDVEN 9.1* 5.2*    Estimated Creatinine Clearance: 13.6 mL/min (A) (by C-G formula based on SCr of 1.79 mg/dL (H)).    No Known Allergies  Antimicrobials this admission: CTX 10/17 x 1 Cefepime 10/17 >> Vancomycin 10/17 >> Flagyl 10/17 >>  Dose adjustments this admission:  Microbiology results: 10/17 BCx >> 10/17 UCx >> 10/17 MRSA PCR >>  Thank you for allowing pharmacy to be a part of this patient's care.  11/17, PharmD, BCPS Clinical Pharmacist 08/14/2021 7:17 PM   **Pharmacist phone directory can now be found on [email or web address] ([email or web address]).  Listed under [facility].

## 2021-08-14 NOTE — Progress Notes (Signed)
Elink following coe sepsis

## 2021-08-14 NOTE — ED Provider Notes (Signed)
[facility] EMERGENCY DEPARTMENT Provider Note   CSN: [id number] Arrival date & time: 08/14/21  1137     History Chief Complaint  Patient presents with   Hypotension    Natalie Stevenson is a 80 y.o. female.  This is a 80 y.o. female with significant medical history as below, including dementia, hypothyroidism who presents to the ED with complaint of AMS, hypotension, delirium.   Level 5 caveat, nonverbal   Natalie Stevenson Niece [phone],   [phone]. Called by nursing home as pt with low blood pressure since last night, behavior changes. No med changes, no further falls. Wheelchair/bed bound at baseline. Can rarely ambulate with 2 person assist short distances. Per niece patient would not want heroic measure, CPR, intubation.   The history is provided by a relative and the EMS personnel.      Past Medical History:  Diagnosis Date   Alzheimer's dementia (HCC)    Lumbago with sciatica    Metabolic encephalopathy    Thyroid disease    hypothyroidism   Tinea pedis     Patient Active Problem List   Diagnosis Date Noted   Severe sepsis (HCC) 08/14/2021   Dementia (HCC) 08/14/2021   Hypothyroidism 08/14/2021   Acute metabolic encephalopathy 08/14/2021   Elevated troponin 08/14/2021   AKI (acute kidney injury) (HCC) 08/14/2021   Elevated liver enzymes 08/14/2021    History reviewed. No pertinent surgical history.   OB History   No obstetric history on file.     No family history on file.  Social History   Tobacco Use   Smoking status: Unknown    Home Medications Prior to Admission medications   Medication Sig Start Date End Date Taking? Authorizing Provider  acetaminophen (TYLENOL) 500 MG tablet Take 500 mg by mouth daily.   Yes [provider]  amLODipine (NORVASC) 10 MG tablet Take 10 mg by mouth daily.   Yes [provider]  diclofenac Sodium (VOLTAREN) 1 % GEL Apply 1 application topically 3 (three) times daily as needed for  pain. 03/28/21  Yes [provider]  donepezil (ARICEPT) 10 MG tablet Take 10 mg by mouth daily.   Yes [provider]  lactose free nutrition (BOOST) LIQD Take 237 mLs by mouth 2 (two) times daily between meals.   Yes [provider]  levothyroxine (SYNTHROID) 200 MCG tablet Take 200 mcg by mouth daily before breakfast.   Yes [provider]  levothyroxine (SYNTHROID) 25 MCG tablet Take 25 mcg by mouth daily before breakfast.   Yes [provider]  lisinopril (ZESTRIL) 20 MG tablet Take 20 mg by mouth daily.   Yes [provider]  LORazepam (ATIVAN) 0.5 MG tablet Take 0.5 mg by mouth See admin instructions. In the morning and once daily as needed for anxiety or agitation   Yes [provider]  melatonin 3 MG TABS tablet Take 3 mg by mouth at bedtime.   Yes [provider]  memantine (NAMENDA) 10 MG tablet Take 10 mg by mouth daily.   Yes [provider]  polyethylene glycol (MIRALAX / GLYCOLAX) 17 g packet Take 17 g by mouth daily as needed for mild constipation.   Yes [provider]    Allergies    Patient has no known allergies.  Review of Systems   Review of Systems  Unable to perform ROS: Patient nonverbal   Physical Exam Updated Vital Signs BP (!) 142/79   Pulse 74   Temp (!) 97.2 F (36.2 C) (Rectal)  Resp 12   Ht 5\' 5"  (1.651 m)   Wt 35.6 kg   SpO2 100%   BMI 13.06 kg/m   Physical Exam Vitals and nursing note reviewed.  Constitutional:      General: She is not in acute distress.    Comments: Frail appearing, cachectic  HENT:     Head: Normocephalic and atraumatic.     Right Ear: External ear normal.     Left Ear: External ear normal.     Nose: Nose normal.     Mouth/Throat:     Mouth: Mucous membranes are dry.  Eyes:     General: No scleral icterus.       Right eye: No discharge.        Left eye: No discharge.  Neck:     Vascular: No carotid bruit.  Cardiovascular:      Rate and Rhythm: Normal rate and regular rhythm.     Pulses: Normal pulses.     Heart sounds: Normal heart sounds.  Pulmonary:     Effort: Pulmonary effort is normal. No respiratory distress.     Breath sounds: Decreased breath sounds present.  Abdominal:     General: Abdomen is flat.     Palpations: Abdomen is soft.     Tenderness: There is no abdominal tenderness.  Musculoskeletal:        General: Normal range of motion.     Right lower leg: No edema.     Left lower leg: No edema.     Comments: No sacral decubitus ulcer  Skin:    General: Skin is warm and dry.     Capillary Refill: Capillary refill takes 2 to 3 seconds.  Neurological:     Mental Status: She is lethargic.     GCS: GCS eye subscore is 4. GCS verbal subscore is 1. GCS motor subscore is 5.     Comments: Not compliant with neuro exam    ED Results / Procedures / Treatments   Labs (all labs ordered are listed, but only abnormal results are displayed) Labs Reviewed  CBC WITH DIFFERENTIAL/PLATELET - Abnormal; Notable for the following components:      Result Value   WBC 15.3 (*)    RBC 3.86 (*)    MCV 103.9 (*)    Neutro Abs 14.1 (*)    Lymphs Abs 0.5 (*)    Abs Immature Granulocytes 0.28 (*)    All other components within normal limits  COMPREHENSIVE METABOLIC PANEL - Abnormal; Notable for the following components:   Sodium 154 (*)    Chloride 115 (*)    CO2 21 (*)    Glucose, Bld 250 (*)    BUN 44 (*)    Creatinine, Ser 2.13 (*)    Albumin 3.4 (*)    AST 1,068 (*)    ALT 814 (*)    Alkaline Phosphatase 173 (*)    GFR, Estimated 23 (*)    Anion gap 18 (*)    All other components within normal limits  URINALYSIS, ROUTINE W REFLEX MICROSCOPIC - Abnormal; Notable for the following components:   APPearance HAZY (*)    Glucose, UA 100 (*)    Hgb urine dipstick LARGE (*)    Protein, ur 30 (*)    All other components within normal limits  OSMOLALITY - Abnormal; Notable for the following components:    Osmolality 355 (*)    All other components within normal limits  LACTIC ACID, PLASMA - Abnormal; Notable for  the following components:   Lactic Acid, Venous 9.1 (*)    All other components within normal limits  LACTIC ACID, PLASMA - Abnormal; Notable for the following components:   Lactic Acid, Venous 5.2 (*)    All other components within normal limits  T4, FREE - Abnormal; Notable for the following components:   Free T4 1.61 (*)    All other components within normal limits  BLOOD GAS, ARTERIAL - Abnormal; Notable for the following components:   pO2, Arterial 171 (*)    Acid-base deficit 4.3 (*)    All other components within normal limits  BASIC METABOLIC PANEL - Abnormal; Notable for the following components:   Sodium 153 (*)    Chloride 119 (*)    CO2 21 (*)    Glucose, Bld 222 (*)    BUN 45 (*)    Creatinine, Ser 1.79 (*)    Calcium 7.9 (*)    GFR, Estimated 28 (*)    All other components within normal limits  URINALYSIS, MICROSCOPIC (REFLEX) - Abnormal; Notable for the following components:   Bacteria, UA MANY (*)    All other components within normal limits  CBG MONITORING, ED - Abnormal; Notable for the following components:   Glucose-Capillary 115 (*)    All other components within normal limits  TROPONIN I (HIGH SENSITIVITY) - Abnormal; Notable for the following components:   Troponin I (High Sensitivity) 3,187 (*)    All other components within normal limits  TROPONIN I (HIGH SENSITIVITY) - Abnormal; Notable for the following components:   Troponin I (High Sensitivity) 4,973 (*)    All other components within normal limits  CULTURE, BLOOD (ROUTINE X 2)  CULTURE, BLOOD (ROUTINE X 2)  TSH  HEPARIN LEVEL (UNFRACTIONATED)  HEPARIN LEVEL (UNFRACTIONATED)  CBC  LACTIC ACID, PLASMA    EKG EKG Interpretation  Date/Time:  Monday August 14 2021 11:49:38 EDT Ventricular Rate:  95 PR Interval:  129 QRS Duration: 100 QT Interval:  376 QTC Calculation: 473 R  Axis:   93 Text Interpretation: Sinus tachycardia with irregular rate Premature ventricular complexes Similar to prior tracing Confirmed by Tanda Rockers ([id number]) on 08/14/2021 3:03:14 PM  Radiology CT Head Wo Contrast  Result Date: 08/14/2021 CLINICAL DATA:  Delirium.  Hypotension. EXAM: CT HEAD WITHOUT CONTRAST TECHNIQUE: Contiguous axial images were obtained from the base of the skull through the vertex without intravenous contrast. COMPARISON:  Prior head CT examinations 08/09/2021 and earlier. FINDINGS: Brain: Moderate cerebral atrophy.  Comparatively mild cerebellar atrophy. Mild patchy and ill-defined hypoattenuation within the cerebral white matter, nonspecific but compatible chronic small vessel ischemic disease. There is no acute intracranial hemorrhage. No demarcated cortical infarct. No extra-axial fluid collection. No evidence of an intracranial mass. No midline shift. Vascular: No hyperdense vessel. Atherosclerotic calcifications. Skull: Normal. Negative for fracture or focal lesion. Sinuses/Orbits: Visualized orbits show no acute finding. No significant paranasal sinus disease at the imaged levels. IMPRESSION: No evidence of acute intracranial abnormality. Mild chronic small vessel ischemic changes within the cerebral white matter. Moderate cerebral atrophy.  Comparatively mild cerebellar atrophy. Electronically Signed   By: Jackey Loge D.O.   On: 08/14/2021 13:03   DG Chest Portable 1 View  Result Date: 08/14/2021 CLINICAL DATA:  Altered mental status EXAM: PORTABLE CHEST 1 VIEW COMPARISON:  06/10/2021 FINDINGS: Lungs are hyperinflated. Normal cardiac silhouette. No effusion, infiltrate or pneumothorax. No acute osseous abnormality. RIGHT nipple shadow noted. IMPRESSION: Hyperinflated lungs.  No acute findings. Electronically Signed   By: Roseanne Reno  Amil Amen M.D.   On: 08/14/2021 13:36    Procedures .Critical Care E&M Performed by: Sloan Leiter, DO  Critical care provider statement:     Critical care time (minutes):  45   Critical care time was exclusive of:  Separately billable procedures and treating other patients   Critical care was necessary to treat or prevent imminent or life-threatening deterioration of the following conditions:  Sepsis and dehydration   Critical care was time spent personally by me on the following activities:  Development of treatment plan with patient or surrogate, discussions with consultants, evaluation of patient's response to treatment, examination of patient and obtaining history from patient or surrogate   Care discussed with: admitting provider   After initial E/M assessment, critical care services were subsequently performed that were exclusive of separately billable procedures or treatment.     Medications Ordered in ED Medications  heparin ADULT infusion 100 units/mL (25000 units/276mL) (600 Units/hr Intravenous New Bag/Given 08/14/21 1608)    And  heparin bolus via infusion 3,000 Units (3,000 Units Intravenous Bolus from Bag 08/14/21 1603)  0.45 % sodium chloride infusion ( Intravenous New Bag/Given 08/14/21 1647)  lactated ringers bolus 500 mL (has no administration in time range)  sodium chloride 0.9 % bolus 1,000 mL (0 mLs Intravenous Stopped 08/14/21 1513)  cefTRIAXone (ROCEPHIN) 2 g in sodium chloride 0.9 % 100 mL IVPB (0 g Intravenous Stopped 08/14/21 1415)  lactated ringers bolus 1,000 mL (0 mLs Intravenous Stopped 08/14/21 1647)  aspirin suppository 300 mg (300 mg Rectal Given 08/14/21 1600)    ED Course  I have reviewed the triage vital signs and the nursing notes.  Pertinent labs & imaging results that were available during my care of the patient were reviewed by me and considered in my medical decision making (see chart for details).    MDM Rules/Calculators/A&P                         This patient complains of hypotension; this involves an extensive number of treatment Options and is a complaint that carries with it a  high risk of complications and Morbidity. Vital signs reviewed, blood pressure reduced on arrival, mild hypothermic, mild tachycardic. Non-verbal. Serious etiologies considered.   I ordered, reviewed and interpreted labs.  Troponin acutely elevated, EKG with artifact but no acute STEMI. Give ASA and start heparin infusion for ACS. CXR non-acute.   Patient with AKI and hypernatremia, concern for significant dehydration and lack of PO given report from nursing home. Osm is pending, continue IVF.  Pt with LA 9.1, leukocytosis, UTI, end organ dysfunction - concern for septic shock. Blood pressure stable after sepsis bolus. Code sepsis was paged and IV abx were started after obtaining blood ctx, urine ctx also sent.  D/w niece, patient is DNR/DNI  I ordered imaging studies which included CXR, CTH and I independently    visualized and interpreted imaging which showed no acute process  Additional history obtained from niece  Previous records obtained and reviewed   Given DNR/DNI will admit patient to step down. Attempted to updated family regarding disposition but unable to reach on telephone.   D/w [facility] who accepts patient for admission.   Final Clinical Impression(s) / ED Diagnoses Final diagnoses:  Shock liver  NSTEMI (non-ST elevated myocardial infarction) (HCC)  AKI (acute kidney injury) (HCC)  Dehydration  Delirium  Septic shock (HCC)  Urinary tract infection without hematuria, site unspecified    Rx / DC  Orders ED Discharge Orders     None        Sloan Leiter, DO 08/14/21 1818

## 2021-08-15 DIAGNOSIS — N179 Acute kidney failure, unspecified: Secondary | ICD-10-CM | POA: Diagnosis not present

## 2021-08-15 DIAGNOSIS — G309 Alzheimer's disease, unspecified: Secondary | ICD-10-CM | POA: Diagnosis not present

## 2021-08-15 DIAGNOSIS — G9341 Metabolic encephalopathy: Secondary | ICD-10-CM | POA: Diagnosis not present

## 2021-08-15 DIAGNOSIS — E039 Hypothyroidism, unspecified: Secondary | ICD-10-CM

## 2021-08-15 DIAGNOSIS — A419 Sepsis, unspecified organism: Secondary | ICD-10-CM | POA: Diagnosis not present

## 2021-08-15 DIAGNOSIS — F0280 Dementia in other diseases classified elsewhere without behavioral disturbance: Secondary | ICD-10-CM

## 2021-08-15 LAB — COMPREHENSIVE METABOLIC PANEL
ALT: 1217 U/L — ABNORMAL HIGH (ref 0–44)
AST: 2104 U/L — ABNORMAL HIGH (ref 15–41)
Albumin: 3.1 g/dL — ABNORMAL LOW (ref 3.5–5.0)
Alkaline Phosphatase: 160 U/L — ABNORMAL HIGH (ref 38–126)
Anion gap: 10 (ref 5–15)
BUN: 38 mg/dL — ABNORMAL HIGH (ref 8–23)
CO2: 25 mmol/L (ref 22–32)
Calcium: 8.4 mg/dL — ABNORMAL LOW (ref 8.9–10.3)
Chloride: 116 mmol/L — ABNORMAL HIGH (ref 98–111)
Creatinine, Ser: 1.29 mg/dL — ABNORMAL HIGH (ref 0.44–1.00)
GFR, Estimated: 41 mL/min — ABNORMAL LOW (ref >60)
Glucose, Bld: 222 mg/dL — ABNORMAL HIGH (ref 70–99)
Potassium: 3.6 mmol/L (ref 3.5–5.1)
Sodium: 151 mmol/L — ABNORMAL HIGH (ref 135–145)
Total Bilirubin: 0.4 mg/dL (ref 0.3–1.2)
Total Protein: 6.7 g/dL (ref 6.5–8.1)

## 2021-08-15 LAB — PROCALCITONIN: Procalcitonin: 4.11 ng/mL

## 2021-08-15 LAB — HEPARIN LEVEL (UNFRACTIONATED)
Heparin Unfractionated: 0.16 [IU]/mL — ABNORMAL LOW (ref 0.30–0.70)
Heparin Unfractionated: 0.49 IU/mL (ref 0.30–0.70)

## 2021-08-15 LAB — TROPONIN I (HIGH SENSITIVITY): Troponin I (High Sensitivity): 3646 ng/L (ref <18)

## 2021-08-15 MED ORDER — ENOXAPARIN SODIUM 30 MG/0.3ML IJ SOSY
30.0000 mg | PREFILLED_SYRINGE | INTRAMUSCULAR | Status: DC
  Administered 2021-08-16 – 2021-08-18 (×3): 30 mg via SUBCUTANEOUS
  Filled 2021-08-15 (×3): qty 0.3

## 2021-08-15 MED ORDER — SODIUM CHLORIDE 0.9 % IV SOLN
2.0000 g | INTRAVENOUS | Status: DC
  Administered 2021-08-15: 2 g via INTRAVENOUS
  Filled 2021-08-15: qty 2

## 2021-08-15 MED ORDER — HEPARIN BOLUS VIA INFUSION
1000.0000 [IU] | Freq: Once | INTRAVENOUS | Status: AC
  Administered 2021-08-15: 1000 [IU] via INTRAVENOUS
  Filled 2021-08-15: qty 1000

## 2021-08-15 MED ORDER — LEVOTHYROXINE SODIUM 100 MCG/5ML IV SOLN: 100.0000 ug | Freq: Every day | INTRAVENOUS | Status: DC

## 2021-08-15 MED ORDER — CHLORHEXIDINE GLUCONATE CLOTH 2 % EX PADS
6.0000 | MEDICATED_PAD | Freq: Every day | CUTANEOUS | Status: DC
Start: 2021-08-15 — End: 2021-08-18
  Administered 2021-08-15 – 2021-08-17 (×3): 6 via TOPICAL

## 2021-08-15 MED ORDER — VANCOMYCIN HCL 500 MG/100ML IV SOLN: 500.0000 mg | INTRAVENOUS | Status: DC

## 2021-08-15 MED ORDER — BISACODYL 10 MG RE SUPP: 10.0000 mg | Freq: Every day | RECTAL | Status: DC | PRN

## 2021-08-15 NOTE — Progress Notes (Signed)
ANTICOAGULATION CONSULT NOTE   Pharmacy Consult for heparin Indication: chest pain/ACS  No Known Allergies  Patient Measurements: Height: 5\' 5"  (165.1 cm) Weight: 35.6 kg (78 lb 7.7 oz) IBW/kg (Calculated) : 57 HEPARIN DW (KG): 35.6   Vital Signs: Temp: 96 F (35.6 C) (10/18 0736) Temp Source: Rectal (10/18 0736) BP: 179/74 (10/18 0700) Pulse Rate: 72 (10/18 0500)  Labs: Recent Labs    08/14/21 1252 08/14/21 1456 08/14/21 1557 08/14/21 2027 08/14/21 2325 08/15/21 0522 08/15/21 0526  HGB 12.1  --   --   --   --   --   --   HCT 40.1  --   --   --   --   --   --   PLT 175  --   --   --   --   --   --   HEPARINUNFRC  --   --   --   --  0.16*  --  0.49  CREATININE 2.13*  --  1.79*  --   --  1.29*  --   TROPONINIHS 3,18710/20/22*  --  6,013*  --  3,646*  --      Estimated Creatinine Clearance: 18.9 mL/min (A) (by C-G formula based on SCr of 1.29 mg/dL (H)).   Medical History: Past Medical History:  Diagnosis Date   Alzheimer's dementia (HCC)    Lumbago with sciatica    Metabolic encephalopathy    Thyroid disease    hypothyroidism   Tinea pedis     Assessment: Patient with elevated troponins. Pharmacy asked to dose heparin. Not on oral anticoagulants.  HL 0.49- therapeutic  Trop 3187 > 4973 > 6013   Goal of Therapy:  Heparin level 0.3-0.7 units/ml Monitor platelets by anticoagulation protocol: Yes   Plan:  Continue heparin infusion at 700 units/hr F/u 8 hr heparin level Monitor H&H and s/s of bleeding.  06-10-1987, PharmD Clinical Pharmacist 08/15/2021 8:54 AM

## 2021-08-15 NOTE — Progress Notes (Signed)
Phlebotomy has made multiple attempts throughout the night to obtain labs as ordered, with varying levels of success.  Every attempt was made to collect blood at every interval ordered.

## 2021-08-15 NOTE — TOC Initial Note (Signed)
Transition of Care [facility]) - Initial/Assessment Note    Patient Details  Name: Natalie Stevenson MRN: [id number] Date of Birth: Jul 10, 1941  Transition of Care [facility]) CM/SW Contact:    Shade Flood, LCSW Phone Number: 08/15/2021, 2:07 PM  Clinical Narrative:                  Pt admitted from [facility]. Met with pt's niece and nephew at bedside today. Pt has been at [facility] since 2014. She has Alzheimer's Dementia and has been declining over the years. Pt has assistance with all ADLs. She is not able to verbalize. She does not ambulate. Staff at the ALF assist her with transfers to Wheelchair. Niece has spoken with MD about pt's condition and treatment plan.  Workup is in progress. Family anticipating pt will return to [facility] at [employer].   TOC will follow and assist with dc planning.  Expected Discharge Plan: Assisted Living Barriers to Discharge: Continued Medical Work up   Patient Goals and CMS Choice Patient states their goals for this hospitalization and ongoing recovery are:: get better CMS Medicare.gov Compare Post Acute Care list provided to:: Patient Represenative (must comment) Choice offered to / list presented to :  (niece)  Expected Discharge Plan and Services Expected Discharge Plan: Assisted Living In-house Referral: Clinical Social Work     Living arrangements for the past 2 months: [place]                                      Prior Living Arrangements/Services Living arrangements for the past 2 months: [place] Lives with:: Facility Resident Patient language and need for interpreter reviewed:: Yes Do you feel safe going back to the place where you live?: Yes      Need for Family Participation in Patient Care: No (Comment) Care giver support system in place?: Yes (comment)   Criminal Activity/Legal Involvement Pertinent to Current Situation/Hospitalization: No - Comment as needed  Activities of Daily  Living      Permission Sought/Granted                  Emotional Assessment Appearance:: Appears younger than stated age   Affect (typically observed): Calm Orientation: : Oriented to Self Alcohol / Substance Use: Not Applicable Psych Involvement: No (comment)  Admission diagnosis:  Dehydration [E86.0] Delirium [R41.0] Shock liver [K72.00] NSTEMI (non-ST elevated myocardial infarction) ([place]) [I21.4] AKI (acute kidney injury) ([place]) [N17.9] Severe sepsis ([place]) [A41.9, R65.20] Patient Active Problem List   Diagnosis Date Noted   Severe sepsis ([place]) 08/14/2021   Dementia ([place]) 08/14/2021   Hypothyroidism 64/68/0321   Acute metabolic encephalopathy 22/48/2500   Elevated troponin 08/14/2021   AKI (acute kidney injury) ([place]) 08/14/2021   Elevated liver enzymes 08/14/2021   PCP:  Megan Mans, NP Pharmacy:  No Pharmacies Listed    Social Determinants of Health (SDOH) Interventions    Readmission Risk Interventions Readmission Risk Prevention Plan 08/15/2021  Transportation Screening Complete  Medication Review (RN CM) Complete  Some recent data might be hidden

## 2021-08-15 NOTE — Progress Notes (Signed)
ANTICOAGULATION CONSULT NOTE   Pharmacy Consult for heparin Indication: chest pain/ACS  No Known Allergies  Patient Measurements: Height: 5\' 5"  (165.1 cm) Weight: 35.6 kg (78 lb 7.7 oz) IBW/kg (Calculated) : 57 HEPARIN DW (KG): 35.6   Vital Signs: Temp: 97.2 F (36.2 C) (10/17 1815) Temp Source: Rectal (10/17 1815) BP: 187/92 (10/17 1830) Pulse Rate: 74 (10/17 1600)  Labs: Recent Labs    08/14/21 1252 08/14/21 1456 08/14/21 1557 08/14/21 2027 08/14/21 2325  HGB 12.1  --   --   --   --   HCT 40.1  --   --   --   --   PLT 175  --   --   --   --   HEPARINUNFRC  --   --   --   --  0.16*  CREATININE 2.13*  --  1.79*  --   --   TROPONINIHS 3,18710/19/22*  --  6,013*  --      Estimated Creatinine Clearance: 13.6 mL/min (A) (by C-G formula based on SCr of 1.79 mg/dL (H)).   Medical History: Past Medical History:  Diagnosis Date   Alzheimer's dementia (HCC)    Lumbago with sciatica    Metabolic encephalopathy    Thyroid disease    hypothyroidism   Tinea pedis     Assessment: Patient with elevated troponins. Pharmacy asked to dose heparin. Not on oral anticoagulants.  Heparin level subtherapeutic (0.16) on gtt at 600 units/hr. No issues with line or bleeding reported per RN.  Goal of Therapy:  Heparin level 0.3-0.7 units/ml Monitor platelets by anticoagulation protocol: Yes   Plan:  Rebolus heparin 1000 units Increase heparin gtt to 750 units/hr F/u 8 hr heparin level  10-04-2006, PharmD, BCPS Please see amion for complete clinical pharmacist phone list 08/15/2021,12:23 AM

## 2021-08-15 NOTE — Progress Notes (Signed)
PROGRESS NOTE  Sonya Gunnoe YFV:[id number] DOB: May 02, 1941 DOA: 08/14/2021 PCP: Herma Mering, NP  HPI/Recap of past 24 hours: Kieara Schwark is a 80 y.o. female with medical history significant for Alzheimer's dementia, hypothyroidism.  Patient was brought to the ED from [facility] nursing home with reports of hypotension and change in behavior. Patient was last seen normal the day prior to arrival. At baseline patient is wheelchair/bedbound, with advanced dementia. Family reports at baseline, patient cannot hold a conversation and does not recognize family and does not interact. In the ED, BP initially in the 90s but improved after IVF, labs showed WBC 15.3, Cr elevated 2.13, Na 154, markedly elevated liver enzymes AST 1068, ALT 814, ALP 173, but normal total bilirubin 0.9.  UA with many bacteria.  Portable chest x-ray and head CT without acute abnormality.  Elevated troponin at 3187. Pt was started on IV ceftriaxone and heparin drip. Pt admitted for further management.    Today, pt unable to interact with me, non-verbal. Unable to perform ROS, chronically ill appearing and cachetic. Pt noted to be a very hard stick, unable to get labs drawn. Had an extensive conversation with patients niece about clinical status and GOC, she wants to continue all basic treatment except heparin (due to frequent blood draw) and watch for another 24H. Does not want escalation of care whatsoever. Leaning towards hospice/comfort care.     Assessment/Plan: Principal Problem:   Severe sepsis (HCC) Active Problems:   Dementia (HCC)   Hypothyroidism   Acute metabolic encephalopathy   Elevated troponin   AKI (acute kidney injury) (HCC)   Elevated liver enzymes   Severe sepsis Possibly UTI Vs deep tissue injury noted on bilateral feet Vs LLL PNA On admission, noted to be hypothermia of 96.1, HR 105, tachypnea 22, leukocytosis 15.3, lactic acidosis of 9.1 with endorgan dysfunction-shock liver,  encephalopathy, AKI UA positive for infection, UC pending BC X 2 pending Procalcitonin 4.11, will trend Chest x-ray unremarkable CT abd/pelvis showed air-fluid level in bladder, likely infection, LLL airspace disease, large amount of stool in rectum Continue IVF Continue IV cefepime, vancomycin and metronidazole Very poor prognosis   Acute kidney injury Hypernatremia Improving Creatinine 2.13 on admission, baseline 0.6-1 Likely prerenal from hypotension/sepsis in addition to dehydration in the setting of ACE inhibitor use. IVF Hold lisinopril   Metabolic encephalopathy- Likely due to sepsis Advanced Alzheimer's dementia At baseline she is wheelchair/bedbound, does not recognize family cannot hold conversation Head CT without acute abnormality Management as above N.p.o. for now Hold donepezil, Namenda   Elevated troponin- Likely demand ischemia from sepsis Vs NSTEMI 3187 > 4973> 1638>4665   EKG without significant ST or T wave abnormalities Not be a candidate for any cardiac intervention (Extensive discussion has been made with POA) No heroic measures (stop heparin due to frequent blood draw, very hard stick) No need to continue to trend trops Telemetry   Elevated liver enzymes AST 1068, ALT 814, ALP 173, total bilirubin 0.9.  Likely shock liver from hypotension, and possibly sepsis CT Abdominal/pelvis without contrast- unremarkable as mentioned above Daily CMP   Hypothyroidism IV synthroid   HTN Initially hypotensive, now stable Continue to hold lisinopril, Norvasc  Malnutrition Feeding once able Continue npo for now  GOC discussion Had an extensive conversation with patients niece about clinical status and GOC, she wants to continue all basic treatment except heparin (due to frequent blood draw) and watch for another 24H. Does not want escalation of care whatsoever. Leaning towards hospice/comfort care.  Palliative and hospice consulted        Estimated body  mass index is 13.06 kg/m as calculated from the following:   Height as of this encounter: 5\' 5"  (1.651 m).   Weight as of this encounter: 35.6 kg.     Code Status: DNR  Family Communication: Discussed with POA, niece on 08/15/21  Disposition Plan: Status is: Inpatient  Remains inpatient appropriate because: Level of care     Consultants: None  Procedures: None  Antimicrobials: IV cefepime IV Vancomycin IV metronidazole  DVT prophylaxis:  [place] lovenox   Objective: Vitals:   08/15/21 1000 08/15/21 1100 08/15/21 1118 08/15/21 1200  BP: (!) 165/70 130/63  (!) 138/93  Pulse:      Resp: (!) 22 20 19 18   Temp:   (!) 96.9 F (36.1 C)   TempSrc:   Rectal   SpO2:      Weight:      Height:        Intake/Output Summary (Last 24 hours) at 08/15/2021 1324 Last data filed at 08/15/2021 0900 Gross per 24 hour  Intake 2995.57 ml  Output 600 ml  Net 2395.57 ml   Filed Weights   08/14/21 1400 08/14/21 1815  Weight: 49.9 kg 35.6 kg    Exam: General: Chronically ill-appearing, cachetic, non-verbal Cardiovascular: S1, S2 present Respiratory: CTAB Abdomen: Soft, nontender, nondistended, bowel sounds present Musculoskeletal: No bilateral pedal edema noted, noted bilateral heel wound with dressing Skin: As above Psychiatry: Unable to assess    Data Reviewed: CBC: Recent Labs  Lab 08/14/21 1252  WBC 15.3*  NEUTROABS 14.1*  HGB 12.1  HCT 40.1  MCV 103.9*  PLT 175   Basic Metabolic Panel: Recent Labs  Lab 08/14/21 1252 08/14/21 1557 08/15/21 0522  NA 154* 153* 151*  K 4.6 4.3 3.6  CL 115* 119* 116*  CO2 21* 21* 25  GLUCOSE 250* 222* 222*  BUN 44* 45* 38*  CREATININE 2.13* 1.79* 1.29*  CALCIUM 9.0 7.9* 8.4*   GFR: Estimated Creatinine Clearance: 18.9 mL/min (A) (by C-G formula based on SCr of 1.29 mg/dL (H)). Liver Function Tests: Recent Labs  Lab 08/14/21 1252 08/15/21 0522  AST 1,068* 2,104*  ALT 814* 1,217*  ALKPHOS 173* 160*  BILITOT 0.9  0.4  PROT 7.0 6.7  ALBUMIN 3.4* 3.1*   No results for input(s): LIPASE, AMYLASE in the last 168 hours. No results for input(s): AMMONIA in the last 168 hours. Coagulation Profile: No results for input(s): INR, PROTIME in the last 168 hours. Cardiac Enzymes: No results for input(s): CKTOTAL, CKMB, CKMBINDEX, TROPONINI in the last 168 hours. BNP (last 3 results) No results for input(s): PROBNP in the last 8760 hours. HbA1C: No results for input(s): HGBA1C in the last 72 hours. CBG: Recent Labs  Lab 08/14/21 1238  GLUCAP 115*   Lipid Profile: No results for input(s): CHOL, HDL, LDLCALC, TRIG, CHOLHDL, LDLDIRECT in the last 72 hours. Thyroid Function Tests: Recent Labs    08/14/21 1253  TSH 0.389  FREET4 1.61*   Anemia Panel: No results for input(s): VITAMINB12, FOLATE, FERRITIN, TIBC, IRON, RETICCTPCT in the last 72 hours. Urine analysis:    Component Value Date/Time   COLORURINE YELLOW 08/14/2021 1411   APPEARANCEUR HAZY (A) 08/14/2021 1411   APPEARANCEUR Clear 08/20/2013 0340   LABSPEC 1.025 08/14/2021 1411   LABSPEC 1.010 08/20/2013 0340   PHURINE 5.5 08/14/2021 1411   GLUCOSEU 100 (A) 08/14/2021 1411   GLUCOSEU Negative 08/20/2013 0340   HGBUR LARGE (A) 08/14/2021  1411   BILIRUBINUR NEGATIVE 08/14/2021 1411   BILIRUBINUR Negative 08/20/2013 0340   KETONESUR NEGATIVE 08/14/2021 1411   PROTEINUR 30 (A) 08/14/2021 1411   NITRITE NEGATIVE 08/14/2021 1411   LEUKOCYTESUR NEGATIVE 08/14/2021 1411   LEUKOCYTESUR Negative 08/20/2013 0340   Sepsis Labs: @LABRCNTIP (procalcitonin:4,lacticidven:4)  ) Recent Results (from the past 240 hour(s))  Blood culture (routine x 2)     Status: None (Preliminary result)   Collection Time: 08/14/21 12:42 PM   Specimen: Left Antecubital; Blood  Result Value Ref Range Status   Specimen Description LEFT ANTECUBITAL  Final   Special Requests   Final    Blood Culture results may not be optimal due to an inadequate volume of blood  received in culture bottles BOTTLES DRAWN AEROBIC AND ANAEROBIC   Culture   Final    NO GROWTH < 24 HOURS Performed at [facility], [address] [place], [place]    Report Status PENDING  Incomplete  Blood culture (routine x 2)     Status: None (Preliminary result)   Collection Time: 08/14/21 12:51 PM   Specimen: BLOOD RIGHT ARM  Result Value Ref Range Status   Specimen Description BLOOD RIGHT ARM  Final   Special Requests   Final    Blood Culture adequate volume BOTTLES DRAWN AEROBIC AND ANAEROBIC   Culture   Final    NO GROWTH < 24 HOURS Performed at [facility], [address] [place], [place]    Report Status PENDING  Incomplete  MRSA Next Gen by PCR, Nasal     Status: None   Collection Time: 08/14/21  6:30 PM   Specimen: Nasal Mucosa; Nasal Swab  Result Value Ref Range Status   MRSA by PCR Next Gen NOT DETECTED NOT DETECTED Final    Comment: (NOTE) The GeneXpert MRSA Assay (FDA approved for NASAL specimens only), is one component of a comprehensive MRSA colonization surveillance program. It is not intended to diagnose MRSA infection nor to guide or monitor treatment for MRSA infections. Test performance is not FDA approved in patients less than 71 years old. Performed at [facility], [address] [place], [place]       Studies: CT ABDOMEN PELVIS WO CONTRAST  Result Date: 08/14/2021 CLINICAL DATA:  Sepsis, hypotension, elevated liver enzymes, acute kidney injury. EXAM: CT ABDOMEN AND PELVIS WITHOUT CONTRAST TECHNIQUE: Multidetector CT imaging of the abdomen and pelvis was performed following the standard protocol without IV contrast. COMPARISON:  CT abdomen 04/09/2011. FINDINGS: Lower chest: There is atelectasis and airspace disease in the left lower lobe. There are minimal patchy ground-glass opacities in the right lower lobe/lung base. Hepatobiliary: No focal liver abnormality is seen. No gallstones, gallbladder wall thickening, or  biliary dilatation. Pancreas: Not well delineated.  Grossly within normal limits. Spleen: Normal in size without focal abnormality. Adrenals/Urinary Tract: There is a 1.5 cm cyst in the superior pole the right kidney. There is no urinary tract calculus or hydronephrosis. Kidneys are normal in size. Adrenal glands are within normal limits. There is an air-fluid level in the bladder. Stomach/Bowel: Evaluation is limited secondary to lack of intraperitoneal fat and lack of contrast. There is no definite bowel obstruction. No dilated bowel loops are seen. The appendix is not visualized. There is a large amount of stool in the rectum. Vascular/Lymphatic: Aortic atherosclerosis. No enlarged abdominal or pelvic lymph nodes. Reproductive: There are calcified uterine fibroids with the largest measuring 3.5 cm. Ovaries are not well evaluated. Other: No ascites.  No free air.  No focal abdominal wall hernia. Musculoskeletal: No acute fractures are seen. IMPRESSION: 1. Air-fluid level in the bladder. Please correlate clinically for infection. 2. Large amount of stool in the rectum.  No bowel obstruction. 3. Calcified uterine fibroids. 4. Left lower lobe atelectasis and airspace disease. 5.  Aortic Atherosclerosis (ICD10-I70.0). Electronically Signed   By: Darliss Cheney M.D.   On: 08/14/2021 23:25    Scheduled Meds:  Chlorhexidine Gluconate Cloth  6 each Topical Daily   vancomycin variable dose per unstable renal function (pharmacist dosing)   Does not apply See admin instructions    Continuous Infusions:  ceFEPime (MAXIPIME) IV     dextrose 5 % and 0.45% NaCl 100 mL/hr at 08/15/21 0700   heparin 700 Units/hr (08/15/21 0700)   metronidazole Stopped (08/15/21 0900)   [START ON 08/16/2021] vancomycin       LOS: 1 day     Briant Cedar, MD [facility]  If 7PM-7AM, please contact night-coverage [email or web address] 08/15/2021, 1:24 PM

## 2021-08-16 DIAGNOSIS — A419 Sepsis, unspecified organism: Secondary | ICD-10-CM | POA: Diagnosis not present

## 2021-08-16 DIAGNOSIS — R6520 Severe sepsis without septic shock: Secondary | ICD-10-CM | POA: Diagnosis not present

## 2021-08-16 LAB — COMPREHENSIVE METABOLIC PANEL
ALT: 1006 U/L — ABNORMAL HIGH (ref 0–44)
AST: 913 U/L — ABNORMAL HIGH (ref 15–41)
Albumin: 2.7 g/dL — ABNORMAL LOW (ref 3.5–5.0)
Alkaline Phosphatase: 124 U/L (ref 38–126)
Anion gap: 5 (ref 5–15)
BUN: 29 mg/dL — ABNORMAL HIGH (ref 8–23)
CO2: 29 mmol/L (ref 22–32)
Calcium: 8.2 mg/dL — ABNORMAL LOW (ref 8.9–10.3)
Chloride: 115 mmol/L — ABNORMAL HIGH (ref 98–111)
Creatinine, Ser: 1.01 mg/dL — ABNORMAL HIGH (ref 0.44–1.00)
GFR, Estimated: 56 mL/min — ABNORMAL LOW (ref >60)
Glucose, Bld: 122 mg/dL — ABNORMAL HIGH (ref 70–99)
Potassium: 3 mmol/L — ABNORMAL LOW (ref 3.5–5.1)
Sodium: 149 mmol/L — ABNORMAL HIGH (ref 135–145)
Total Bilirubin: 0.8 mg/dL (ref 0.3–1.2)
Total Protein: 5.6 g/dL — ABNORMAL LOW (ref 6.5–8.1)

## 2021-08-16 LAB — CBC
HCT: 37.3 % (ref 36.0–46.0)
Hemoglobin: 11.5 g/dL — ABNORMAL LOW (ref 12.0–15.0)
MCH: 30.4 pg (ref 26.0–34.0)
MCHC: 30.8 g/dL (ref 30.0–36.0)
MCV: 98.7 fL (ref 80.0–100.0)
Platelets: 129 10*3/uL — ABNORMAL LOW (ref 150–400)
RBC: 3.78 MIL/uL — ABNORMAL LOW (ref 3.87–5.11)
RDW: 13.2 % (ref 11.5–15.5)
WBC: 9.4 10*3/uL (ref 4.0–10.5)
nRBC: 0 % (ref 0.0–0.2)

## 2021-08-16 LAB — LACTIC ACID, PLASMA: Lactic Acid, Venous: 1.5 mmol/L (ref 0.5–1.9)

## 2021-08-16 MED ORDER — POTASSIUM CHLORIDE 10 MEQ/100ML IV SOLN
10.0000 meq | INTRAVENOUS | Status: AC
  Administered 2021-08-16 (×6): 10 meq via INTRAVENOUS
  Filled 2021-08-16 (×3): qty 100

## 2021-08-16 MED ORDER — PIPERACILLIN-TAZOBACTAM 3.375 G IVPB
3.3750 g | Freq: Three times a day (TID) | INTRAVENOUS | Status: DC
  Administered 2021-08-16 – 2021-08-18 (×6): 3.375 g via INTRAVENOUS
  Filled 2021-08-16 (×6): qty 50

## 2021-08-16 NOTE — Evaluation (Signed)
Clinical/Bedside Swallow Evaluation Patient Details  Name: Natalie Stevenson MRN: [id number] Date of Birth: 08/03/1941  Today's Date: 08/16/2021 Time: SLP Start Time (ACUTE ONLY): 1415 SLP Stop Time (ACUTE ONLY): 1441 SLP Time Calculation (min) (ACUTE ONLY): 26 min  Past Medical History:  Past Medical History:  Diagnosis Date   Alzheimer's dementia (HCC)    Lumbago with sciatica    Metabolic encephalopathy    Thyroid disease    hypothyroidism   Tinea pedis    Past Surgical History: History reviewed. No pertinent surgical history. HPI:  Natalie Stevenson is a 80 y.o. female with medical history significant for Alzheimer's dementia, hypothyroidism.  Patient was brought to the ED from [facility] nursing home with reports of hypotension and change in behavior. Patient was last seen normal the day prior to arrival. At baseline patient is wheelchair/bedbound, with advanced dementia. Family reports at baseline, patient cannot hold a conversation and does not recognize family and does not interact. BSE requested    Assessment / Plan / Recommendation  Clinical Impression  Clinical swallowing evaluation completed while Pt was sitting upright in bed; Pt was easily roused for trials. Pt presents with a primary cognitive based dysphagia; Pt was unable to suck from the straw despite multiple attempts and strategies attempted. Pt was unable to follow commands and did not demonstrate any verbalizations with SLP, however, she was pleasant and smiled despite being unable to follow commands or respond. When presented with the cup to labial surface, Pt reflexively sipped water from the cup and demonstrated a seemingly timely AP transfer and swallowing trigger upon palpation without overt s/sx of aspiration. When presented yogurt and applesauce Pt demonstrated some oral holding, prolonged AP transit and mild to mod oral residue; when presented with ice cream Pt demonstrated immediate oral manipulation of bolus with  timely AP transit and swallowing trigger. Pt consumed 4oz of ice cream and 8 oz of water without overt s/sx of aspiration. Pt seems to be selective about her PO preference and again note her inability (cognitive based) to suck from a straw at this time. Recommend initiate D1/puree diet and thin liquids. Recommend meds to be crushed in puree. Suspect Pt will be selective about what she will consume as preference for sweet was demonstrated during evaluation; Pt is at risk for aspiraiton, malnutrition and dehydration. Pt will require 1:1 support/feeder for meals and recommend check Pt's oral cavity after PO trials. ST will f/u X1 to ensure diet tolerance. SLP Visit Diagnosis: Dysphagia, unspecified (R13.10)    Aspiration Risk  Mild aspiration risk;Risk for inadequate nutrition/hydration    Diet Recommendation Dysphagia 1 (Puree);Thin liquid   Liquid Administration via: Cup;No straw Medication Administration: Crushed with puree Supervision: Full supervision/cueing for compensatory strategies (Requires feeder) Compensations: Minimize environmental distractions;Small sips/bites;Follow solids with liquid Postural Changes: Seated upright at 90 degrees    Other  Recommendations Oral Care Recommendations: Oral care BID    Recommendations for follow up therapy are one component of a multi-disciplinary discharge planning process, led by the attending physician.  Recommendations may be updated based on patient status, additional functional criteria and insurance authorization.  Follow up Recommendations Skilled Nursing facility;24 hour supervision/assistance      Frequency and Duration min 1 x/week  1 week       Prognosis Prognosis for Safe Diet Advancement: Fair Barriers to Reach Goals: Cognitive deficits      Swallow Study   General Date of Onset: 08/14/21 HPI: Natalie Stevenson is a 80 y.o. female with medical history  significant for Alzheimer's dementia, hypothyroidism.  Patient was brought to  the ED from [facility] nursing home with reports of hypotension and change in behavior. Patient was last seen normal the day prior to arrival. At baseline patient is wheelchair/bedbound, with advanced dementia. Family reports at baseline, patient cannot hold a conversation and does not recognize family and does not interact. BSE requested Type of Study: Bedside Swallow Evaluation Previous Swallow Assessment: none in chart Diet Prior to this Study: NPO Temperature Spikes Noted: No Respiratory Status: Room air History of Recent Intubation: No Behavior/Cognition: Alert;Confused;Doesn't follow directions Oral Cavity Assessment: Dry Oral Care Completed by SLP: Recent completion by staff Oral Cavity - Dentition: Adequate natural dentition Self-Feeding Abilities: Total assist Patient Positioning: Upright in bed Baseline Vocal Quality: Not observed Volitional Cough: Cognitively unable to elicit Volitional Swallow: Unable to elicit    Oral/Motor/Sensory Function Overall Oral Motor/Sensory Function: Within functional limits   Ice Chips Ice chips: Not tested   Thin Liquid Thin Liquid: Within functional limits    Nectar Thick Nectar Thick Liquid: Not tested   Honey Thick Honey Thick Liquid: Not tested   Puree Puree: Within functional limits   Solid     Solid: Not tested     Natalie Stevenson Natalie Stevenson, CCC-SLP Speech Language Pathologist  Natalie Stevenson 08/16/2021,2:45 PM

## 2021-08-16 NOTE — Progress Notes (Signed)
Pt is disoriented X4. NT performed mouth care, and allowed patient to have some water via a mouth sponge. Pt did well until she tried to suck the sponge off of the stick. SLP eval would be appreciated. Will make MD and family aware. Will continue to monitor.

## 2021-08-16 NOTE — Progress Notes (Addendum)
PROGRESS NOTE    Natalie Stevenson  DVV:[id number] DOB: 1941-07-01 DOA: 08/14/2021 PCP: Herma Mering, NP     Brief Narrative:  Natalie Stevenson is a 80 y.o. female with medical history significant for advanced Alzheimer's dementia, hypothyroidism.  Patient was brought to the ED from [facility] nursing home with reports of hypotension and change in behavior. Patient was last seen normal the day prior to arrival. At baseline patient is wheelchair/bedbound, with advanced dementia. Family reports at baseline, patient cannot hold a conversation and does not recognize family and does not interact. She is wheelchair bound and total care for ADLs. In the ED, BP initially in the 90s but improved after IVF, labs showed WBC 15.3, Cr elevated 2.13, Na 154, markedly elevated liver enzymes AST 1068, ALT 814, ALP 173, but normal total bilirubin 0.9.  UA with many bacteria.  Portable chest x-ray and head CT without acute abnormality.  Elevated troponin at 3187. Pt was started on IV ceftriaxone and heparin drip. Pt admitted for further management.  New events last 24 hours / Subjective: Patient minimally awake with verbal stimuli.  She is at baseline nonverbal, wheelchair-bound and total care for ADLs including feeding.  She appears not to be alert enough to take in food by mouth this morning.  Labs trending downward with treatment with IV fluids and IV antibiotics.  Family awaiting palliative care consultation.  Assessment & Plan:   Principal Problem:   Severe sepsis (HCC) Active Problems:   Dementia (HCC)   Hypothyroidism   Acute metabolic encephalopathy   Elevated troponin   AKI (acute kidney injury) (HCC)   Elevated liver enzymes   Severe sepsis secondary to UTI versus bilateral feet deep tissue injury, left lower lobe pneumonia -Sepsis present on admission with hypothermia 96.1, HR 105, RR 22, leukocytosis 15.3, severe lactic acidosis 9.1 with endorgan dysfunction including shock liver,  encephalopathy, acute kidney injury, without shock -Blood cultures negative growth to date -Urine culture positive for Pseudomonas, enterococcus  -MRSA PCR negative.  Stop vancomycin -Continue zosyn   AKI -Baseline creatinine 0.6-1 -Hold lisinopril -Improving on IV fluids  Elevated liver enzymes -Improving with IV fluids, continue to trend LFT  Acute metabolic encephalopathy secondary to sepsis with underlying advanced Alzheimer's dementia -Holding oral medications until patient is more stable to take p.o. -Continue to monitor  Elevated troponin -Demand ischemia from sepsis versus NSTEMI -Patient is not a candidate for any cardiac intervention, Dr. Sharolyn Douglas has discussed with POA and decided on no further escalation of care  Hypothyroidism -Continue IV Synthroid  Hypokalemia -Replace, trend  Hypernatremia -Continue IV fluids, improving  In agreement with assessment of the pressure ulcer as below:  Pressure Injury 08/14/21 Sacrum Medial Deep Tissue Pressure Injury - Purple or maroon localized area of discolored intact skin or blood-filled blister due to damage of underlying soft tissue from pressure and/or shear. (Active)  08/14/21 1815  Location: Sacrum  Location Orientation: Medial  Staging: Deep Tissue Pressure Injury - Purple or maroon localized area of discolored intact skin or blood-filled blister due to damage of underlying soft tissue from pressure and/or shear.  Wound Description (Comments):   Present on Admission: Yes     Pressure Injury 08/14/21 Hip Left Deep Tissue Pressure Injury - Purple or maroon localized area of discolored intact skin or blood-filled blister due to damage of underlying soft tissue from pressure and/or shear. (Active)  08/14/21 1815  Location: Hip  Location Orientation: Left  Staging: Deep Tissue Pressure Injury - Purple or maroon localized  area of discolored intact skin or blood-filled blister due to damage of underlying soft tissue from  pressure and/or shear.  Wound Description (Comments):   Present on Admission: Yes     Pressure Injury 08/14/21 Ankle Left;Lateral Deep Tissue Pressure Injury - Purple or maroon localized area of discolored intact skin or blood-filled blister due to damage of underlying soft tissue from pressure and/or shear. (Active)  08/14/21 1815  Location: Ankle  Location Orientation: Left;Lateral  Staging: Deep Tissue Pressure Injury - Purple or maroon localized area of discolored intact skin or blood-filled blister due to damage of underlying soft tissue from pressure and/or shear.  Wound Description (Comments):   Present on Admission: Yes     Pressure Injury 08/14/21 Ankle Right Deep Tissue Pressure Injury - Purple or maroon localized area of discolored intact skin or blood-filled blister due to damage of underlying soft tissue from pressure and/or shear. (Active)  08/14/21 1815  Location: Ankle  Location Orientation: Right  Staging: Deep Tissue Pressure Injury - Purple or maroon localized area of discolored intact skin or blood-filled blister due to damage of underlying soft tissue from pressure and/or shear.  Wound Description (Comments):   Present on Admission: Yes         DVT prophylaxis:  enoxaparin (LOVENOX) injection 30 mg Start: 08/16/21 0600  Code Status:     Code Status Orders  (From admission, onward)           Start     Ordered   08/14/21 1858  Do not attempt resuscitation (DNR)  Continuous       Question Answer Comment  In the event of cardiac or respiratory ARREST Do not call a "code blue"   In the event of cardiac or respiratory ARREST Do not perform Intubation, CPR, defibrillation or ACLS   In the event of cardiac or respiratory ARREST Use medication by any route, position, wound care, and other measures to relive pain and suffering. May use oxygen, suction and manual treatment of airway obstruction as needed for comfort.      08/14/21 1900           Code  Status History     Date Active Date Inactive Code Status Order ID Comments User Context   08/14/2021 1311 08/14/2021 1900 DNR [id number]  Sloan Leiter, DO ED      Family Communication: Niece and her husband at bedside Disposition Plan:  Status is: Inpatient  Remains inpatient appropriate because: Altered mental status not at baseline, remains on IV antibiotics.   If patient's labs and vital signs improved and remained stable and her mentation is back to her baseline where she can take food by mouth, patient could be transferred back to her ALF.  However, if patient decompensates in any way or if she is not alert to take food and medications by mouth, will need to consider hospice.  Palliative care consultation is pending      Consultants:  Palliative care medicine  Procedures:  None  Antimicrobials:  Anti-infectives (From admission, onward)    Start     Dose/Rate Route Frequency Ordered Stop   08/16/21 2000  vancomycin (VANCOREADY) IVPB 500 mg/100 mL        500 mg 100 mL/hr over 60 Minutes Intravenous Every 48 hours 08/15/21 0902     08/15/21 2100  ceFEPIme (MAXIPIME) 1 g in sodium chloride 0.9 % 100 mL IVPB  Status:  Discontinued        1 g 200 mL/hr over  30 Minutes Intravenous Every 24 hours 08/14/21 1921 08/15/21 0857   08/15/21 2100  ceFEPIme (MAXIPIME) 2 g in sodium chloride 0.9 % 100 mL IVPB        2 g 200 mL/hr over 30 Minutes Intravenous Every 24 hours 08/15/21 0857     08/14/21 2100  ceFEPIme (MAXIPIME) 2 g in sodium chloride 0.9 % 100 mL IVPB  Status:  Discontinued        2 g 200 mL/hr over 30 Minutes Intravenous Every 24 hours 08/14/21 1919 08/14/21 1921   08/14/21 2015  vancomycin (VANCOREADY) IVPB 750 mg/150 mL        750 mg 150 mL/hr over 60 Minutes Intravenous  Once 08/14/21 1919 08/14/21 2041   08/14/21 2015  ceFEPIme (MAXIPIME) 2 g in sodium chloride 0.9 % 100 mL IVPB        2 g 200 mL/hr over 30 Minutes Intravenous  Once 08/14/21 1921 08/14/21 2050    08/14/21 2000  metroNIDAZOLE (FLAGYL) IVPB 500 mg        500 mg 100 mL/hr over 60 Minutes Intravenous Every 12 hours 08/14/21 1900     08/14/21 1918  vancomycin variable dose per unstable renal function (pharmacist dosing)  Status:  Discontinued         Does not apply See admin instructions 08/14/21 1919 08/16/21 0839   08/14/21 1215  cefTRIAXone (ROCEPHIN) 2 g in sodium chloride 0.9 % 100 mL IVPB        2 g 200 mL/hr over 30 Minutes Intravenous  Once 08/14/21 1209 08/14/21 1415        Objective: Vitals:   08/16/21 1000 08/16/21 1100 08/16/21 1145 08/16/21 1329  BP: 136/80   (!) 171/114  Pulse: (!) 117 70  66  Resp: 11 14  13   Temp:   99 F (37.2 C)   TempSrc:   Axillary   SpO2: 100% 100%  100%  Weight:      Height:        Intake/Output Summary (Last 24 hours) at 08/16/2021 1347 Last data filed at 08/16/2021 0600 Gross per 24 hour  Intake 945.67 ml  Output 950 ml  Net -4.33 ml   Filed Weights   08/14/21 1400 08/14/21 1815  Weight: 49.9 kg 35.6 kg    Examination:  General exam: Appears calm and comfortable  Respiratory system: Clear to auscultation anteriorly  Cardiovascular system: S1 & S2 heard. No murmurs. No pedal edema. Gastrointestinal system: Abdomen is nondistended, soft and nontender.  Central nervous system: Arousable to voice Extremities: Symmetric in appearance  Psychiatry: Advanced dementia and nonverbal at baseline  Data Reviewed: I have personally reviewed following labs and imaging studies  CBC: Recent Labs  Lab 08/14/21 1252 08/16/21 0439  WBC 15.3* 9.4  NEUTROABS 14.1*  --   HGB 12.1 11.5*  HCT 40.1 37.3  MCV 103.9* 98.7  PLT 175 129*   Basic Metabolic Panel: Recent Labs  Lab 08/14/21 1252 08/14/21 1557 08/15/21 0522 08/16/21 0439  NA 154* 153* 151* 149*  K 4.6 4.3 3.6 3.0*  CL 115* 119* 116* 115*  CO2 21* 21* 25 29  GLUCOSE 250* 222* 222* 122*  BUN 44* 45* 38* 29*  CREATININE 2.13* 1.79* 1.29* 1.01*  CALCIUM 9.0 7.9* 8.4*  8.2*   GFR: Estimated Creatinine Clearance: 24.1 mL/min (A) (by C-G formula based on SCr of 1.01 mg/dL (H)). Liver Function Tests: Recent Labs  Lab 08/14/21 1252 08/15/21 0522 08/16/21 0439  AST 1,068* 2,104* 913*  ALT  814* 1,217* 1,006*  ALKPHOS 173* 160* 124  BILITOT 0.9 0.4 0.8  PROT 7.0 6.7 5.6*  ALBUMIN 3.4* 3.1* 2.7*   No results for input(s): LIPASE, AMYLASE in the last 168 hours. No results for input(s): AMMONIA in the last 168 hours. Coagulation Profile: No results for input(s): INR, PROTIME in the last 168 hours. Cardiac Enzymes: No results for input(s): CKTOTAL, CKMB, CKMBINDEX, TROPONINI in the last 168 hours. BNP (last 3 results) No results for input(s): PROBNP in the last 8760 hours. HbA1C: No results for input(s): HGBA1C in the last 72 hours. CBG: Recent Labs  Lab 08/14/21 1238  GLUCAP 115*   Lipid Profile: No results for input(s): CHOL, HDL, LDLCALC, TRIG, CHOLHDL, LDLDIRECT in the last 72 hours. Thyroid Function Tests: Recent Labs    08/14/21 1253  TSH 0.389  FREET4 1.61*   Anemia Panel: No results for input(s): VITAMINB12, FOLATE, FERRITIN, TIBC, IRON, RETICCTPCT in the last 72 hours. Sepsis Labs: Recent Labs  Lab 08/14/21 1252 08/14/21 1557 08/14/21 2027 08/15/21 0851 08/16/21 0439  PROCALCITON 1.21  --   --  4.11  --   LATICACIDVEN 9.1* 5.2* 5.0*  --  1.5    Recent Results (from the past 240 hour(s))  Blood culture (routine x 2)     Status: None (Preliminary result)   Collection Time: 08/14/21 12:42 PM   Specimen: Left Antecubital; Blood  Result Value Ref Range Status   Specimen Description LEFT ANTECUBITAL  Final   Special Requests   Final    Blood Culture results may not be optimal due to an inadequate volume of blood received in culture bottles BOTTLES DRAWN AEROBIC AND ANAEROBIC   Culture   Final    NO GROWTH 2 DAYS Performed at [facility], [address], [place], [place] [address]    Report Status PENDING  Incomplete   Blood culture (routine x 2)     Status: None (Preliminary result)   Collection Time: 08/14/21 12:51 PM   Specimen: BLOOD RIGHT ARM  Result Value Ref Range Status   Specimen Description BLOOD RIGHT ARM  Final   Special Requests   Final    Blood Culture adequate volume BOTTLES DRAWN AEROBIC AND ANAEROBIC   Culture   Final    NO GROWTH 2 DAYS Performed at [facility], [address] [place], [place] [address]    Report Status PENDING  Incomplete  MRSA Next Gen by PCR, Nasal     Status: None   Collection Time: 08/14/21  6:30 PM   Specimen: Nasal Mucosa; Nasal Swab  Result Value Ref Range Status   MRSA by PCR Next Gen NOT DETECTED NOT DETECTED Final    Comment: (NOTE) The GeneXpert MRSA Assay (FDA approved for NASAL specimens only), is one component of a comprehensive MRSA colonization surveillance program. It is not intended to diagnose MRSA infection nor to guide or monitor treatment for MRSA infections. Test performance is not FDA approved in patients less than 54 years old. Performed at [facility], [address] [place], [place] [address]   Urine Culture     Status: Abnormal (Preliminary result)   Collection Time: 08/15/21  7:35 AM   Specimen: In/Out Cath Urine  Result Value Ref Range Status   Specimen Description   Final    IN/OUT CATH URINE Performed at [facility], [address], [place], [place] [address]    Special Requests   Final    NONE Performed at [facility], [address], [place],   [address]    Culture (A)  Final    >=100,000 COLONIES/mL PSEUDOMONAS AERUGINOSA SUSCEPTIBILITIES TO FOLLOW 80,000 COLONIES/mL ENTEROCOCCUS FAECALIS CULTURE REINCUBATED FOR BETTER GROWTH Performed at [facility], [address] [place], [place] [address]    Report Status PENDING  Incomplete      Radiology Studies: CT ABDOMEN PELVIS WO CONTRAST  Result Date: 08/14/2021 CLINICAL DATA:  Sepsis, hypotension, elevated liver enzymes, acute kidney injury. EXAM:  CT ABDOMEN AND PELVIS WITHOUT CONTRAST TECHNIQUE: Multidetector CT imaging of the abdomen and pelvis was performed following the standard protocol without IV contrast. COMPARISON:  CT abdomen 04/09/2011. FINDINGS: Lower chest: There is atelectasis and airspace disease in the left lower lobe. There are minimal patchy ground-glass opacities in the right lower lobe/lung base. Hepatobiliary: No focal liver abnormality is seen. No gallstones, gallbladder wall thickening, or biliary dilatation. Pancreas: Not well delineated.  Grossly within normal limits. Spleen: Normal in size without focal abnormality. Adrenals/Urinary Tract: There is a 1.5 cm cyst in the superior pole the right kidney. There is no urinary tract calculus or hydronephrosis. Kidneys are normal in size. Adrenal glands are within normal limits. There is an air-fluid level in the bladder. Stomach/Bowel: Evaluation is limited secondary to lack of intraperitoneal fat and lack of contrast. There is no definite bowel obstruction. No dilated bowel loops are seen. The appendix is not visualized. There is a large amount of stool in the rectum. Vascular/Lymphatic: Aortic atherosclerosis. No enlarged abdominal or pelvic lymph nodes. Reproductive: There are calcified uterine fibroids with the largest measuring 3.5 cm. Ovaries are not well evaluated. Other: No ascites.  No free air.  No focal abdominal wall hernia. Musculoskeletal: No acute fractures are seen. IMPRESSION: 1. Air-fluid level in the bladder. Please correlate clinically for infection. 2. Large amount of stool in the rectum.  No bowel obstruction. 3. Calcified uterine fibroids. 4. Left lower lobe atelectasis and airspace disease. 5.  Aortic Atherosclerosis (ICD10-I70.0). Electronically Signed   By: Darliss Cheney M.D.   On: 08/14/2021 23:25      Scheduled Meds:  Chlorhexidine Gluconate Cloth  6 each Topical Daily   enoxaparin (LOVENOX) injection  30 mg Subcutaneous Q24H   [START ON 08/18/2021]  levothyroxine  100 mcg Intravenous Daily   Continuous Infusions:  ceFEPime (MAXIPIME) IV Stopped (08/15/21 2109)   metronidazole 500 mg (08/16/21 0915)   potassium chloride 10 mEq (08/16/21 1340)   vancomycin       LOS: 2 days      Time spent: 35 minutes   Noralee Stain, DO [facility] 08/16/2021, 1:47 PM   Available via Epic secure chat 7am-7pm After these hours, please refer to coverage provider listed on [email or web address]

## 2021-08-16 NOTE — Progress Notes (Signed)
Spoke with patient's caregiver from ALF and states that patient eats a mechanical soft diet at the facility. Attempted to give patient yogurt and water and patient was unable to swallow it successfully as she pocketed it in her mouth.

## 2021-08-17 DIAGNOSIS — E46 Unspecified protein-calorie malnutrition: Secondary | ICD-10-CM

## 2021-08-17 DIAGNOSIS — A419 Sepsis, unspecified organism: Secondary | ICD-10-CM | POA: Diagnosis not present

## 2021-08-17 DIAGNOSIS — I214 Non-ST elevation (NSTEMI) myocardial infarction: Secondary | ICD-10-CM

## 2021-08-17 DIAGNOSIS — E860 Dehydration: Secondary | ICD-10-CM

## 2021-08-17 DIAGNOSIS — K7200 Acute and subacute hepatic failure without coma: Secondary | ICD-10-CM | POA: Diagnosis not present

## 2021-08-17 DIAGNOSIS — Z7189 Other specified counseling: Secondary | ICD-10-CM

## 2021-08-17 DIAGNOSIS — Z515 Encounter for palliative care: Secondary | ICD-10-CM

## 2021-08-17 DIAGNOSIS — R6520 Severe sepsis without septic shock: Secondary | ICD-10-CM | POA: Diagnosis not present

## 2021-08-17 LAB — CBC
HCT: 32.5 % — ABNORMAL LOW (ref 36.0–46.0)
Hemoglobin: 10.1 g/dL — ABNORMAL LOW (ref 12.0–15.0)
MCH: 30.4 pg (ref 26.0–34.0)
MCHC: 31.1 g/dL (ref 30.0–36.0)
MCV: 97.9 fL (ref 80.0–100.0)
Platelets: 113 10*3/uL — ABNORMAL LOW (ref 150–400)
RBC: 3.32 MIL/uL — ABNORMAL LOW (ref 3.87–5.11)
RDW: 13.4 % (ref 11.5–15.5)
WBC: 9.2 10*3/uL (ref 4.0–10.5)
nRBC: 0 % (ref 0.0–0.2)

## 2021-08-17 LAB — BASIC METABOLIC PANEL
Anion gap: 5 (ref 5–15)
BUN: 23 mg/dL (ref 8–23)
CO2: 26 mmol/L (ref 22–32)
Calcium: 8.2 mg/dL — ABNORMAL LOW (ref 8.9–10.3)
Chloride: 116 mmol/L — ABNORMAL HIGH (ref 98–111)
Creatinine, Ser: 0.83 mg/dL (ref 0.44–1.00)
GFR, Estimated: >60 mL/min (ref >60)
Glucose, Bld: 118 mg/dL — ABNORMAL HIGH (ref 70–99)
Potassium: 3.1 mmol/L — ABNORMAL LOW (ref 3.5–5.1)
Sodium: 147 mmol/L — ABNORMAL HIGH (ref 135–145)

## 2021-08-17 LAB — HEPATIC FUNCTION PANEL
ALT: 720 U/L — ABNORMAL HIGH (ref 0–44)
AST: 360 U/L — ABNORMAL HIGH (ref 15–41)
Albumin: 2.5 g/dL — ABNORMAL LOW (ref 3.5–5.0)
Alkaline Phosphatase: 128 U/L — ABNORMAL HIGH (ref 38–126)
Bilirubin, Direct: 0.2 mg/dL (ref 0.0–0.2)
Indirect Bilirubin: 0.6 mg/dL (ref 0.3–0.9)
Total Bilirubin: 0.8 mg/dL (ref 0.3–1.2)
Total Protein: 5.2 g/dL — ABNORMAL LOW (ref 6.5–8.1)

## 2021-08-17 LAB — MAGNESIUM: Magnesium: 2 mg/dL (ref 1.7–2.4)

## 2021-08-17 MED ORDER — LEVOTHYROXINE SODIUM 100 MCG PO TABS
200.0000 ug | ORAL_TABLET | Freq: Every day | ORAL | Status: DC
  Administered 2021-08-18: 200 ug via ORAL
  Filled 2021-08-17: qty 2

## 2021-08-17 MED ORDER — AMLODIPINE BESYLATE 5 MG PO TABS
10.0000 mg | ORAL_TABLET | Freq: Every day | ORAL | Status: DC
  Administered 2021-08-17 – 2021-08-18 (×2): 10 mg via ORAL
  Filled 2021-08-17 (×2): qty 2

## 2021-08-17 MED ORDER — LEVOTHYROXINE SODIUM 25 MCG PO TABS
25.0000 ug | ORAL_TABLET | Freq: Every day | ORAL | Status: DC
  Administered 2021-08-18: 25 ug via ORAL
  Filled 2021-08-17: qty 1

## 2021-08-17 MED ORDER — DONEPEZIL HCL 5 MG PO TABS
10.0000 mg | ORAL_TABLET | Freq: Every day | ORAL | Status: DC
  Administered 2021-08-17: 10 mg via ORAL
  Filled 2021-08-17: qty 2

## 2021-08-17 MED ORDER — POTASSIUM CHLORIDE IN NACL 40-0.9 MEQ/L-% IV SOLN: INTRAVENOUS | Status: DC

## 2021-08-17 MED ORDER — MEMANTINE HCL 10 MG PO TABS
10.0000 mg | ORAL_TABLET | Freq: Every day | ORAL | Status: DC
  Administered 2021-08-17 – 2021-08-18 (×2): 10 mg via ORAL
  Filled 2021-08-17 (×2): qty 1

## 2021-08-17 NOTE — TOC Progression Note (Signed)
Transition of Care [facility]) - Progression Note    Patient Details  Name: Natalie Stevenson MRN: [id number] Date of Birth: [birth year]  Transition of Care [facility] CM/SW Contact  Villa Herb, [place] Phone Number: 08/17/2021, 10:31 AM  Clinical Narrative:    CSW spoke to [place] with [facility] who states pt can return at D/C. CSW inquired as to if pt can return over the weekend. Pt can return to facility over the weekend however, she cannot if she needs new medications as the pharmacy they use is closed on the weekends. Damian Leavell states pt can return to the facility with hospice and they would like to use [facility]. CSW left VM for [facility] with hospice for callback. Referral will be made at callback. CSW updated Attending of this information. TOC to follow.   Expected Discharge Plan: Assisted Living Barriers to Discharge: Continued Medical Work up  Expected Discharge Plan and Services Expected Discharge Plan: Assisted Living In-house Referral: Clinical Social Work     Living arrangements for the past 2 months: Assisted Living Facility                                       Social Determinants of Health (SDOH) Interventions    Readmission Risk Interventions Readmission Risk Prevention Plan 08/15/2021  Transportation Screening Complete  Medication Review (RN CM) Complete  Some recent data might be hidden

## 2021-08-17 NOTE — Consult Note (Signed)
Palliative Care Consult Note                                  Date: 08/17/2021   Patient Name: Natalie Stevenson  DOB: April 01, 1941  MRN: [id number]  Age / Sex: 80 y.o., female  PCP: Megan Mans, NP Referring Physician: Dessa Phi, DO  Reason for Consultation: Establishing goals of care  HPI/Patient Profile: 80 y.o. female  with past medical history of advanced Alzheimer's dementia, hypothyroidism presented on 08/14/2021 from [facility] with reports of hypotension and behavioral changes.  Last known normal the day prior to presentation.  Per family, at baseline, the patient is not conversational, does not recognize family, does not interact.  She is wheelchair-bound and total care for ADLs.  She was initially hypotensive with dramatic increase in transaminases likely shock liver.  Chest x-ray and head CT without abnormality, troponins elevated at 3187.    Admitting diagnosis of severe sepsis secondary to UTI and left lower lobe pneumonia.  Noted severe lactic acidosis on admission, on antibiotics.  Also with acute kidney injury improving with IV fluids, acute metabolic encephalopathy secondary to sepsis with underlying advanced dementia.  Troponin elevation query demand ischemia versus NSTEMI, not a candidate for cardiac intervention and after discussion with power of attorney decided no further escalation of care.  PMT was consulted for goals of care discussion.  Family seems to be interested in home hospice at her facility.  Past Medical History:  Diagnosis Date   Alzheimer's dementia ([place])    Lumbago with sciatica    Metabolic encephalopathy    Thyroid disease    hypothyroidism   Tinea pedis     Subjective:   This NP Walden Field reviewed medical records, received report from team, assessed the patient and then meet at the patient's bedside to discuss diagnosis, prognosis, GOC, EOL wishes disposition and options.  I met  with the patient at the bedside, although she is not coherently conversational.  I spoke with her niece Natalie Stevenson by phone.   Concept of Palliative Care was introduced as specialized medical care for people and their families living with serious illness.  If focuses on providing relief from the symptoms and stress of a serious illness.  The goal is to improve quality of life for both the patient and the family. Values and goals of care important to patient and family were attempted to be elicited.  Created space and opportunity for patient  and family to explore thoughts and feelings regarding current medical situation   Natural trajectory and current clinical status were discussed. Questions and concerns addressed. Patient  encouraged to call with questions or concerns.    Patient/Family Understanding of Illness: The patient's niece states that she was diagnosed with Alzheimer's several years ago.  Over the past year they have had a noted decline.  She last physically saw her aunt 2 months ago (prior to seeing her yesterday in the hospital) and at that point she was talking and humming but not really coherently.  She does note that she has had 3-4 falls in the last year.  She is now wheelchair-bound but she did fall from a seated position last week.  The facility called indicating she was having mental status changes and low blood pressure and irritating her to the ER.  She was found to be very dehydrated.  She was told her kidneys, liver, and heart are  having issues and she is also septic.  She is clear that she will not have much improvement in her quality of life from her current status.  Life Review: The patient is 1 of 6 girls.  The patient's niece is the only surviving relative.  Today's Discussion: Today I discussed the patient's prognosis, trajectory of progressive Alzheimer's disease, unlikelihood of improvement in quality of life and subsequently felt not a candidate for cardiac  work-up/intervention.  The patient's niece states that she has watched the patient's other 5 sisters, including Natalie Stevenson mother, pass away and she is unfortunately familiar with the transition from life to death and hospice services.  She states the patient was never married and does not have children.  After we discussed several options available, the patient did elect "home" hospice to be provided at the patient's nursing home.  She states she spoke with somebody at [facility], but she is unsure what the status is.  I provided emotional and general support through therapeutic listening.  I answered several questions and addressed all concerns.  Review of Systems  Unable to perform ROS: Dementia   Objective:   Primary Diagnoses: Present on Admission:  Severe sepsis ([place])  Dementia ([place])  Hypothyroidism  Acute metabolic encephalopathy  Elevated troponin  AKI (acute kidney injury) ([place])  Elevated liver enzymes   Physical Exam Vitals and nursing note reviewed.  Constitutional:      General: She is sleeping.     Appearance: She is cachectic. She is ill-appearing.  HENT:     Head: Normocephalic and atraumatic.  Pulmonary:     Effort: Pulmonary effort is normal. No respiratory distress.     Breath sounds: Normal breath sounds. No wheezing or rhonchi.  Abdominal:     General: Abdomen is flat.     Palpations: Abdomen is soft.  Neurological:     Mental Status: She is easily aroused. She is disoriented.     Comments: Mumbling incoherently, not answering questions or making substantial eye contact.    Vital Signs:  BP (!) 182/82   Pulse (!) 56   Temp (!) 96.8 F (36 C) (Axillary)   Resp 12   Ht 5' 5"  (1.651 m)   Wt 35.6 kg   SpO2 100%   BMI 13.06 kg/m   Palliative Assessment/Data: 20%    Advanced Care Planning:   Primary Decision Maker: NEXT OF KIN  Code Status/Advance Care Planning: DNR  A discussion was had today regarding advanced directives.  Concepts specific to code status, artifical feeding and hydration, continued IV antibiotics and rehospitalization was had.  The difference between a aggressive medical intervention path and a palliative comfort care path for this patient at this time was had.   Decisions/Changes to ACP: Remain DNR  Assessment & Plan:   Impression: 80 year old female with advanced/end-stage Alzheimer's dementia not functionally conversational.  The patient has had a significant decline over the past months to year including functional decline with recent falls, cognitive decline, now total care for all ADLs and wheelchair-bound/bedbound.  Acute issues brought the patient to the hospital and these have been treated and appeared to be improving.  However, her overall prognosis is quite poor given her advanced underlying conditions.  SUMMARY OF RECOMMENDATIONS   Agree with home hospice in the nursing home Remain DNR No escalation of care Anticipate discharge in next 24 to 48 hours Will follow up with [facility] for status of referral PMT will continue to follow  Symptom Management:  Per primary team PMT is available to assist as needed  Prognosis:  < 6 months  Discharge Planning:  Home with Hospice   Discussed with: Patient's family, nursing team, medical team, [facility] colleague, [facility]   ADDENDUM: I spoke with Natalie Stevenson at [facility]. The referral has been receive. No plans to evaluate patient while IP, will send a nurse to the nursing home when she discharges.   Thank you for allowing [place] to participate in the care of Natalie Stevenson PMT will continue to support holistically.  Time Total: 70 min  Greater than 50%  of this time was spent counseling and coordinating care related to the above assessment and plan.  Signed by: Walden Field, NP Palliative Medicine Team  Team Phone # [phone] (Nights/Weekends)  08/17/2021, 11:30 AM

## 2021-08-17 NOTE — Progress Notes (Signed)
PROGRESS NOTE    Natalie Stevenson  JAS:[id number] DOB: [birth year] DOA: 08/14/2021 PCP: Herma Mering, NP     Brief Narrative:  Natalie Stevenson is a 80 y.o. female with medical history significant for advanced Alzheimer's dementia, hypothyroidism.  Patient was brought to the ED from [facility] nursing home with reports of hypotension and change in behavior. Patient was last seen normal the day prior to arrival. At baseline patient is wheelchair/bedbound, with advanced dementia. Family reports at baseline, patient cannot hold a conversation and does not recognize family and does not interact. She is wheelchair bound and total care for ADLs. In the ED, BP initially in the 90s but improved after IVF, labs showed WBC 15.3, Cr elevated 2.13, Na 154, markedly elevated liver enzymes AST 1068, ALT 814, ALP 173, but normal total bilirubin 0.9.  UA with many bacteria.  Portable chest x-ray and head CT without acute abnormality.  Elevated troponin at 3187. Pt was started on IV ceftriaxone and heparin drip. Pt admitted for further management.  New events last 24 hours / Subjective: Patient is alert, was able to eat breakfast with assistance from nurse tech this morning.  She remains nonverbal, but tracks with her eyes.  Awaiting palliative care consultation.  Assessment & Plan:   Principal Problem:   Severe sepsis (HCC) Active Problems:   Dementia (HCC)   Hypothyroidism   Acute metabolic encephalopathy   Elevated troponin   AKI (acute kidney injury) (HCC)   Elevated liver enzymes   Severe sepsis secondary to UTI versus left lower lobe pneumonia -Sepsis present on admission with hypothermia 96.1, HR 105, RR 22, leukocytosis 15.3, severe lactic acidosis 9.1 with endorgan dysfunction including shock liver, encephalopathy, acute kidney injury, without shock -Blood cultures negative growth to date -Urine culture positive for Pseudomonas, enterococcus -susceptibility pending -MRSA PCR negative.  Stop  vancomycin -Continue zosyn   AKI -Baseline creatinine 0.6-1 -Hold lisinopril -Resolved  Elevated liver enzymes -Improving with IV fluids, continue to trend LFT  Acute metabolic encephalopathy secondary to sepsis with underlying advanced Alzheimer's dementia -Appears to be back to her baseline -Continue Aricept, Namenda  Hypertension -Continue Norvasc  Elevated troponin -Demand ischemia from sepsis versus NSTEMI -Patient is not a candidate for any cardiac intervention, Dr. Sharolyn Douglas has discussed with POA and decided on no further escalation of care  Hypothyroidism -Continue Synthroid  Hypokalemia -Replace, trend  Hypernatremia -Continue IV fluids, improving  In agreement with assessment of the pressure ulcer as below:  Pressure Injury 08/14/21 Sacrum Medial Deep Tissue Pressure Injury - Purple or maroon localized area of discolored intact skin or blood-filled blister due to damage of underlying soft tissue from pressure and/or shear. (Active)  08/14/21 1815  Location: Sacrum  Location Orientation: Medial  Staging: Deep Tissue Pressure Injury - Purple or maroon localized area of discolored intact skin or blood-filled blister due to damage of underlying soft tissue from pressure and/or shear.  Wound Description (Comments):   Present on Admission: Yes     Pressure Injury 08/14/21 Hip Left Deep Tissue Pressure Injury - Purple or maroon localized area of discolored intact skin or blood-filled blister due to damage of underlying soft tissue from pressure and/or shear. (Active)  08/14/21 1815  Location: Hip  Location Orientation: Left  Staging: Deep Tissue Pressure Injury - Purple or maroon localized area of discolored intact skin or blood-filled blister due to damage of underlying soft tissue from pressure and/or shear.  Wound Description (Comments):   Present on Admission: Yes  Pressure Injury 08/14/21 Ankle Left;Lateral Deep Tissue Pressure Injury - Purple or maroon  localized area of discolored intact skin or blood-filled blister due to damage of underlying soft tissue from pressure and/or shear. (Active)  08/14/21 1815  Location: Ankle  Location Orientation: Left;Lateral  Staging: Deep Tissue Pressure Injury - Purple or maroon localized area of discolored intact skin or blood-filled blister due to damage of underlying soft tissue from pressure and/or shear.  Wound Description (Comments):   Present on Admission: Yes     Pressure Injury 08/14/21 Ankle Right Deep Tissue Pressure Injury - Purple or maroon localized area of discolored intact skin or blood-filled blister due to damage of underlying soft tissue from pressure and/or shear. (Active)  08/14/21 1815  Location: Ankle  Location Orientation: Right  Staging: Deep Tissue Pressure Injury - Purple or maroon localized area of discolored intact skin or blood-filled blister due to damage of underlying soft tissue from pressure and/or shear.  Wound Description (Comments):   Present on Admission: Yes         DVT prophylaxis:  enoxaparin (LOVENOX) injection 30 mg Start: 08/16/21 0600  Code Status: DNR  Family Communication: No family at bedside today Disposition Plan:  Status is: Inpatient  Remains inpatient appropriate because: Remains on IV antibiotics pending susceptibility of urine culture Enterococcus.  Palliative care consultation pending.  Suspect patient to return back to ALF with outpatient hospice to follow.  Now that patient is back to her baseline and labs are improving, doubt she will qualify for residential hospice but will wait for palliative care input.     Consultants:  Palliative care medicine  Procedures:  None  Antimicrobials:  Anti-infectives (From admission, onward)    Start     Dose/Rate Route Frequency Ordered Stop   08/16/21 2000  vancomycin (VANCOREADY) IVPB 500 mg/100 mL  Status:  Discontinued        500 mg 100 mL/hr over 60 Minutes Intravenous Every 48 hours  08/15/21 0902 08/16/21 1355   08/16/21 1500  piperacillin-tazobactam (ZOSYN) IVPB 3.375 g        3.375 g 12.5 mL/hr over 240 Minutes Intravenous Every 8 hours 08/16/21 1420     08/15/21 2100  ceFEPIme (MAXIPIME) 1 g in sodium chloride 0.9 % 100 mL IVPB  Status:  Discontinued        1 g 200 mL/hr over 30 Minutes Intravenous Every 24 hours 08/14/21 1921 08/15/21 0857   08/15/21 2100  ceFEPIme (MAXIPIME) 2 g in sodium chloride 0.9 % 100 mL IVPB  Status:  Discontinued        2 g 200 mL/hr over 30 Minutes Intravenous Every 24 hours 08/15/21 0857 08/16/21 1420   08/14/21 2100  ceFEPIme (MAXIPIME) 2 g in sodium chloride 0.9 % 100 mL IVPB  Status:  Discontinued        2 g 200 mL/hr over 30 Minutes Intravenous Every 24 hours 08/14/21 1919 08/14/21 1921   08/14/21 2015  vancomycin (VANCOREADY) IVPB 750 mg/150 mL        750 mg 150 mL/hr over 60 Minutes Intravenous  Once 08/14/21 1919 08/14/21 2041   08/14/21 2015  ceFEPIme (MAXIPIME) 2 g in sodium chloride 0.9 % 100 mL IVPB        2 g 200 mL/hr over 30 Minutes Intravenous  Once 08/14/21 1921 08/14/21 2050   08/14/21 2000  metroNIDAZOLE (FLAGYL) IVPB 500 mg  Status:  Discontinued        500 mg 100 mL/hr over 60 Minutes  Intravenous Every 12 hours 08/14/21 1900 08/16/21 1355   08/14/21 1918  vancomycin variable dose per unstable renal function (pharmacist dosing)  Status:  Discontinued         Does not apply See admin instructions 08/14/21 1919 08/16/21 0839   08/14/21 1215  cefTRIAXone (ROCEPHIN) 2 g in sodium chloride 0.9 % 100 mL IVPB        2 g 200 mL/hr over 30 Minutes Intravenous  Once 08/14/21 1209 08/14/21 1415        Objective: Vitals:   08/17/21 0900 08/17/21 1000 08/17/21 1100 08/17/21 1200  BP: (!) 135/55 (!) 155/57 (!) 182/82 (!) 148/59  Pulse: (!) 57 (!) 54 (!) 56 (!) 56  Resp: (!) 9 10 12 10   Temp:      TempSrc:      SpO2: 100% 100% 100% 100%  Weight:      Height:        Intake/Output Summary (Last 24 hours) at  08/17/2021 1255 Last data filed at 08/17/2021 0825 Gross per 24 hour  Intake 1231.22 ml  Output 450 ml  Net 781.22 ml    Filed Weights   08/14/21 1400 08/14/21 1815  Weight: 49.9 kg 35.6 kg    Examination:  General exam: Appears calm and comfortable  Respiratory system: Clear to auscultation anteriorly  Cardiovascular system: S1 & S2 heard. No murmurs. No pedal edema. Gastrointestinal system: Abdomen is nondistended, soft and nontender.  Central nervous system: Alert Extremities: Symmetric in appearance  Psychiatry: Advanced dementia and nonverbal at baseline  Data Reviewed: I have personally reviewed following labs and imaging studies  CBC: Recent Labs  Lab 08/14/21 1252 08/16/21 0439 08/17/21 0421  WBC 15.3* 9.4 9.2  NEUTROABS 14.1*  --   --   HGB 12.1 11.5* 10.1*  HCT 40.1 37.3 32.5*  MCV 103.9* 98.7 97.9  PLT 175 129* 113*    Basic Metabolic Panel: Recent Labs  Lab 08/14/21 1252 08/14/21 1557 08/15/21 0522 08/16/21 0439 08/17/21 0421  NA 154* 153* 151* 149* 147*  K 4.6 4.3 3.6 3.0* 3.1*  CL 115* 119* 116* 115* 116*  CO2 21* 21* 25 29 26   GLUCOSE 250* 222* 222* 122* 118*  BUN 44* 45* 38* 29* 23  CREATININE 2.13* 1.79* 1.29* 1.01* 0.83  CALCIUM 9.0 7.9* 8.4* 8.2* 8.2*  MG  --   --   --   --  2.0    GFR: Estimated Creatinine Clearance: 29.4 mL/min (by C-G formula based on SCr of 0.83 mg/dL). Liver Function Tests: Recent Labs  Lab 08/14/21 1252 08/15/21 0522 08/16/21 0439 08/17/21 0421  AST 1,068* 2,104* 913* 360*  ALT 814* 1,217* 1,006* 720*  ALKPHOS 173* 160* 124 128*  BILITOT 0.9 0.4 0.8 0.8  PROT 7.0 6.7 5.6* 5.2*  ALBUMIN 3.4* 3.1* 2.7* 2.5*    No results for input(s): LIPASE, AMYLASE in the last 168 hours. No results for input(s): AMMONIA in the last 168 hours. Coagulation Profile: No results for input(s): INR, PROTIME in the last 168 hours. Cardiac Enzymes: No results for input(s): CKTOTAL, CKMB, CKMBINDEX, TROPONINI in the last  168 hours. BNP (last 3 results) No results for input(s): PROBNP in the last 8760 hours. HbA1C: No results for input(s): HGBA1C in the last 72 hours. CBG: Recent Labs  Lab 08/14/21 1238  GLUCAP 115*    Lipid Profile: No results for input(s): CHOL, HDL, LDLCALC, TRIG, CHOLHDL, LDLDIRECT in the last 72 hours. Thyroid Function Tests: No results for input(s): TSH, T4TOTAL, FREET4,  T3FREE, THYROIDAB in the last 72 hours.  Anemia Panel: No results for input(s): VITAMINB12, FOLATE, FERRITIN, TIBC, IRON, RETICCTPCT in the last 72 hours. Sepsis Labs: Recent Labs  Lab 08/14/21 1252 08/14/21 1557 08/14/21 2027 08/15/21 0851 08/16/21 0439  PROCALCITON 1.21  --   --  4.11  --   LATICACIDVEN 9.1* 5.2* 5.0*  --  1.5     Recent Results (from the past 240 hour(s))  Blood culture (routine x 2)     Status: None (Preliminary result)   Collection Time: 08/14/21 12:42 PM   Specimen: Left Antecubital; Blood  Result Value Ref Range Status   Specimen Description LEFT ANTECUBITAL  Final   Special Requests   Final    Blood Culture results may not be optimal due to an inadequate volume of blood received in culture bottles BOTTLES DRAWN AEROBIC AND ANAEROBIC   Culture   Final    NO GROWTH 3 DAYS Performed at [facility], [address] [place], [place] [address]    Report Status PENDING  Incomplete  Blood culture (routine x 2)     Status: None (Preliminary result)   Collection Time: 08/14/21 12:51 PM   Specimen: BLOOD RIGHT ARM  Result Value Ref Range Status   Specimen Description BLOOD RIGHT ARM  Final   Special Requests   Final    Blood Culture adequate volume BOTTLES DRAWN AEROBIC AND ANAEROBIC   Culture   Final    NO GROWTH 3 DAYS Performed at [facility], [address] [place], [place] [address]    Report Status PENDING  Incomplete  MRSA Next Gen by PCR, Nasal     Status: None   Collection Time: 08/14/21  6:30 PM   Specimen: Nasal Mucosa; Nasal Swab  Result Value Ref Range  Status   MRSA by PCR Next Gen NOT DETECTED NOT DETECTED Final    Comment: (NOTE) The GeneXpert MRSA Assay (FDA approved for NASAL specimens only), is one component of a comprehensive MRSA colonization surveillance program. It is not intended to diagnose MRSA infection nor to guide or monitor treatment for MRSA infections. Test performance is not FDA approved in patients less than 45 years old. Performed at [facility], [address], [place], [place] [address]   Urine Culture     Status: Abnormal (Preliminary result)   Collection Time: 08/15/21  7:35 AM   Specimen: In/Out Cath Urine  Result Value Ref Range Status   Specimen Description   Final    IN/OUT CATH URINE Performed at [facility], [address] [place], [place] [address]    Special Requests   Final    NONE Performed at [facility], [address], [place], [place] [address]    Culture (A)  Final    >=100,000 COLONIES/mL PSEUDOMONAS AERUGINOSA 80,000 COLONIES/mL ENTEROCOCCUS FAECALIS SUSCEPTIBILITIES TO FOLLOW Performed at [facility], [address], [place], [place] [address]    Report Status PENDING  Incomplete   Organism ID, Bacteria PSEUDOMONAS AERUGINOSA (A)  Final      Susceptibility   Pseudomonas aeruginosa - MIC*    CEFTAZIDIME 4 SENSITIVE Sensitive     CIPROFLOXACIN <=0.25 SENSITIVE Sensitive     GENTAMICIN <=1 SENSITIVE Sensitive     IMIPENEM 1 SENSITIVE Sensitive     PIP/TAZO 8 SENSITIVE Sensitive     CEFEPIME 2 SENSITIVE Sensitive     * >=100,000 COLONIES/mL PSEUDOMONAS AERUGINOSA       Radiology Studies: No results found.    Scheduled  Meds:  Chlorhexidine Gluconate Cloth  6 each Topical Daily   enoxaparin (LOVENOX) injection  30 mg Subcutaneous Q24H   [START ON 08/18/2021] levothyroxine  100 mcg Intravenous Daily   Continuous Infusions:  0.9 % NaCl with KCl 40 mEq / L 75 mL/hr at 08/17/21 0819   piperacillin-tazobactam (ZOSYN)  IV 3.375 g (08/17/21 0646)     LOS: 3 days       Time spent: 25 minutes   Noralee Stain, DO [facility] 08/17/2021, 12:55 PM   Available via Epic secure chat 7am-7pm After these hours, please refer to coverage provider listed on [email or web address]

## 2021-08-18 DIAGNOSIS — A419 Sepsis, unspecified organism: Secondary | ICD-10-CM | POA: Diagnosis not present

## 2021-08-18 DIAGNOSIS — R6520 Severe sepsis without septic shock: Secondary | ICD-10-CM | POA: Diagnosis not present

## 2021-08-18 LAB — CBC
HCT: 42.3 % (ref 36.0–46.0)
Hemoglobin: 12.9 g/dL (ref 12.0–15.0)
MCH: 30.6 pg (ref 26.0–34.0)
MCHC: 30.5 g/dL (ref 30.0–36.0)
MCV: 100.5 fL — ABNORMAL HIGH (ref 80.0–100.0)
Platelets: 95 10*3/uL — ABNORMAL LOW (ref 150–400)
RBC: 4.21 MIL/uL (ref 3.87–5.11)
RDW: 13.6 % (ref 11.5–15.5)
WBC: 10.9 10*3/uL — ABNORMAL HIGH (ref 4.0–10.5)
nRBC: 0 % (ref 0.0–0.2)

## 2021-08-18 LAB — COMPREHENSIVE METABOLIC PANEL
ALT: 578 U/L — ABNORMAL HIGH (ref 0–44)
AST: 165 U/L — ABNORMAL HIGH (ref 15–41)
Albumin: 2.8 g/dL — ABNORMAL LOW (ref 3.5–5.0)
Alkaline Phosphatase: 149 U/L — ABNORMAL HIGH (ref 38–126)
Anion gap: 11 (ref 5–15)
BUN: 10 mg/dL (ref 8–23)
CO2: 25 mmol/L (ref 22–32)
Calcium: 8.2 mg/dL — ABNORMAL LOW (ref 8.9–10.3)
Chloride: 108 mmol/L (ref 98–111)
Creatinine, Ser: 0.64 mg/dL (ref 0.44–1.00)
GFR, Estimated: >60 mL/min (ref >60)
Glucose, Bld: 128 mg/dL — ABNORMAL HIGH (ref 70–99)
Potassium: 3.8 mmol/L (ref 3.5–5.1)
Sodium: 144 mmol/L (ref 135–145)
Total Bilirubin: 1.1 mg/dL (ref 0.3–1.2)
Total Protein: 5.9 g/dL — ABNORMAL LOW (ref 6.5–8.1)

## 2021-08-18 LAB — URINE CULTURE: Culture: >=100000 — AB

## 2021-08-18 LAB — MAGNESIUM: Magnesium: 2 mg/dL (ref 1.7–2.4)

## 2021-08-18 LAB — GLUCOSE, CAPILLARY
Glucose-Capillary: 108 mg/dL — ABNORMAL HIGH (ref 70–99)
Glucose-Capillary: 153 mg/dL — ABNORMAL HIGH (ref 70–99)

## 2021-08-18 MED ORDER — AMPICILLIN 500 MG PO CAPS: 500.0000 mg | ORAL_CAPSULE | Freq: Four times a day (QID) | ORAL | 0 refills | Status: AC

## 2021-08-18 MED ORDER — CIPROFLOXACIN HCL 500 MG PO TABS: 500.0000 mg | ORAL_TABLET | Freq: Two times a day (BID) | ORAL | 0 refills | Status: AC

## 2021-08-18 NOTE — Discharge Summary (Signed)
Physician Discharge Summary  Natalie Stevenson ZOX:[id number] DOB: February 10, 1941 DOA: 08/14/2021  PCP: Natalie Mering, NP  Admit date: 08/14/2021 Discharge date: 08/18/2021  Admitted From: ALF  Disposition:  ALF with hospice   Recommendations for Outpatient Follow-up:  Follow up with PCP in 1 week  Discharge Condition: Stable CODE STATUS: DNR  Diet recommendation:  Diet Orders (From admission, onward)     Start     Ordered   08/16/21 1448  DIET - DYS 1 Room service appropriate? Yes; Fluid consistency: Thin  Diet effective now       Comments: Crush meds and provide in ice cream :)  Question Answer Comment  Room service appropriate? Yes   Fluid consistency: Thin      08/16/21 1447           Brief/Interim Summary: Natalie Stevenson is a 80 y.o. female with medical history significant for advanced Alzheimer's dementia, hypothyroidism.  Patient was brought to the ED from [facility] nursing home with reports of hypotension and change in behavior. Patient was last seen normal the day prior to arrival. At baseline patient is wheelchair/bedbound, with advanced dementia. Family reports at baseline, patient cannot hold a conversation and does not recognize family and does not interact. She is wheelchair bound and total care for ADLs. In the ED, BP initially in the 90s but improved after IVF, labs showed WBC 15.3, Cr elevated 2.13, Na 154, markedly elevated liver enzymes AST 1068, ALT 814, ALP 173, but normal total bilirubin 0.9.  UA with many bacteria.  Portable chest x-ray and head CT without acute abnormality.  Elevated troponin at 3187. Pt was started on IV ceftriaxone and heparin drip. Pt admitted for further management. Urine culture positive for Pseudomonas and Enterococcus.  Antibiotic was changed to Zosyn and ultimately ampicillin/Cipro for discharge.  Due to patient's multiple lab abnormalities including AKI, shock liver, demand ischemia, hypokalemia and hypernatremia, palliative care was  consulted.  Family ultimately decided to transition patient to home with home hospice due to continued decline with advanced dementia.  Discharge Diagnoses:  Principal Problem:   Severe sepsis (HCC) Active Problems:   Dementia (HCC)   Hypothyroidism   Acute metabolic encephalopathy   Elevated troponin   AKI (acute kidney injury) (HCC)   Elevated liver enzymes   Severe sepsis secondary to UTI versus left lower lobe pneumonia -Sepsis present on admission with hypothermia 96.1, HR 105, RR 22, leukocytosis 15.3, severe lactic acidosis 9.1 with endorgan dysfunction including shock liver, encephalopathy, acute kidney injury, without shock -Blood cultures negative growth to date -Urine culture positive for Pseudomonas, enterococcus  -MRSA PCR negative.  Stop vancomycin -Zosyn --> ampicillin and Cipro for discharge -Oxygen saturation 91% on room air and does not qualify for oxygen  AKI -Baseline creatinine 0.6-1 -Hold lisinopril -Resolved   Elevated liver enzymes -Improved with IVF   Acute metabolic encephalopathy secondary to sepsis with underlying advanced Alzheimer's dementia -Appears to be back to her baseline -Continue Aricept, Namenda   Hypertension -Continue Norvasc  Elevated troponin -Demand ischemia from sepsis versus NSTEMI -Patient is not a candidate for any cardiac intervention, Dr. Sharolyn Douglas has discussed with POA and decided on no further escalation of care   Hypothyroidism -Continue Synthroid   Hypernatremia -Resolved with IVF    In agreement with assessment of the pressure ulcer as below:  Pressure Injury 08/14/21 Sacrum Medial Deep Tissue Pressure Injury - Purple or maroon localized area of discolored intact skin or blood-filled blister due to damage of underlying soft tissue  from pressure and/or shear. (Active)  08/14/21 1815  Location: Sacrum  Location Orientation: Medial  Staging: Deep Tissue Pressure Injury - Purple or maroon localized area of  discolored intact skin or blood-filled blister due to damage of underlying soft tissue from pressure and/or shear.  Wound Description (Comments):   Present on Admission: Yes     Pressure Injury 08/14/21 Hip Left Deep Tissue Pressure Injury - Purple or maroon localized area of discolored intact skin or blood-filled blister due to damage of underlying soft tissue from pressure and/or shear. (Active)  08/14/21 1815  Location: Hip  Location Orientation: Left  Staging: Deep Tissue Pressure Injury - Purple or maroon localized area of discolored intact skin or blood-filled blister due to damage of underlying soft tissue from pressure and/or shear.  Wound Description (Comments):   Present on Admission: Yes     Pressure Injury 08/14/21 Ankle Left;Lateral Deep Tissue Pressure Injury - Purple or maroon localized area of discolored intact skin or blood-filled blister due to damage of underlying soft tissue from pressure and/or shear. (Active)  08/14/21 1815  Location: Ankle  Location Orientation: Left;Lateral  Staging: Deep Tissue Pressure Injury - Purple or maroon localized area of discolored intact skin or blood-filled blister due to damage of underlying soft tissue from pressure and/or shear.  Wound Description (Comments):   Present on Admission: Yes     Pressure Injury 08/14/21 Ankle Right Deep Tissue Pressure Injury - Purple or maroon localized area of discolored intact skin or blood-filled blister due to damage of underlying soft tissue from pressure and/or shear. (Active)  08/14/21 1815  Location: Ankle  Location Orientation: Right  Staging: Deep Tissue Pressure Injury - Purple or maroon localized area of discolored intact skin or blood-filled blister due to damage of underlying soft tissue from pressure and/or shear.  Wound Description (Comments):   Present on Admission: Yes       Discharge Instructions  Discharge Instructions     Increase activity slowly   Complete by: As directed     No wound care   Complete by: As directed       Allergies as of 08/18/2021   No Known Allergies      Medication List     STOP taking these medications    LORazepam 0.5 MG tablet Commonly known as: ATIVAN       TAKE these medications    acetaminophen 500 MG tablet Commonly known as: TYLENOL Take 500 mg by mouth daily.   amLODipine 10 MG tablet Commonly known as: NORVASC Take 10 mg by mouth daily.   ampicillin 500 MG capsule Commonly known as: PRINCIPEN Take 1 capsule (500 mg total) by mouth 4 (four) times daily for 4 days.   ciprofloxacin 500 MG tablet Commonly known as: Cipro Take 1 tablet (500 mg total) by mouth 2 (two) times daily for 4 days.   diclofenac Sodium 1 % Gel Commonly known as: VOLTAREN Apply 1 application topically 3 (three) times daily as needed for pain.   donepezil 10 MG tablet Commonly known as: ARICEPT Take 10 mg by mouth daily.   lactose free nutrition Liqd Take 237 mLs by mouth 2 (two) times daily between meals.   levothyroxine 200 MCG tablet Commonly known as: SYNTHROID Take 200 mcg by mouth daily before breakfast.   levothyroxine 25 MCG tablet Commonly known as: SYNTHROID Take 25 mcg by mouth daily before breakfast.   lisinopril 20 MG tablet Commonly known as: ZESTRIL Take 20 mg by mouth daily.  melatonin 3 MG Tabs tablet Take 3 mg by mouth at bedtime.   memantine 10 MG tablet Commonly known as: NAMENDA Take 10 mg by mouth daily.   polyethylene glycol 17 g packet Commonly known as: MIRALAX / GLYCOLAX Take 17 g by mouth daily as needed for mild constipation.        Follow-up Information     Natalie Mering, NP Follow up.   Specialty: Nurse Practitioner Contact information: [address] [place] [address] [phone]                No Known Allergies  Consultations: Palliative care    Procedures/Studies: CT ABDOMEN PELVIS WO CONTRAST  Result Date: 08/14/2021 CLINICAL DATA:   Sepsis, hypotension, elevated liver enzymes, acute kidney injury. EXAM: CT ABDOMEN AND PELVIS WITHOUT CONTRAST TECHNIQUE: Multidetector CT imaging of the abdomen and pelvis was performed following the standard protocol without IV contrast. COMPARISON:  CT abdomen 04/09/2011. FINDINGS: Lower chest: There is atelectasis and airspace disease in the left lower lobe. There are minimal patchy ground-glass opacities in the right lower lobe/lung base. Hepatobiliary: No focal liver abnormality is seen. No gallstones, gallbladder wall thickening, or biliary dilatation. Pancreas: Not well delineated.  Grossly within normal limits. Spleen: Normal in size without focal abnormality. Adrenals/Urinary Tract: There is a 1.5 cm cyst in the superior pole the right kidney. There is no urinary tract calculus or hydronephrosis. Kidneys are normal in size. Adrenal glands are within normal limits. There is an air-fluid level in the bladder. Stomach/Bowel: Evaluation is limited secondary to lack of intraperitoneal fat and lack of contrast. There is no definite bowel obstruction. No dilated bowel loops are seen. The appendix is not visualized. There is a large amount of stool in the rectum. Vascular/Lymphatic: Aortic atherosclerosis. No enlarged abdominal or pelvic lymph nodes. Reproductive: There are calcified uterine fibroids with the largest measuring 3.5 cm. Ovaries are not well evaluated. Other: No ascites.  No free air.  No focal abdominal wall hernia. Musculoskeletal: No acute fractures are seen. IMPRESSION: 1. Air-fluid level in the bladder. Please correlate clinically for infection. 2. Large amount of stool in the rectum.  No bowel obstruction. 3. Calcified uterine fibroids. 4. Left lower lobe atelectasis and airspace disease. 5.  Aortic Atherosclerosis (ICD10-I70.0). Electronically Signed   By: Darliss Cheney M.D.   On: 08/14/2021 23:25   CT Head Wo Contrast  Result Date: 08/14/2021 CLINICAL DATA:  Delirium.  Hypotension. EXAM:  CT HEAD WITHOUT CONTRAST TECHNIQUE: Contiguous axial images were obtained from the base of the skull through the vertex without intravenous contrast. COMPARISON:  Prior head CT examinations 08/09/2021 and earlier. FINDINGS: Brain: Moderate cerebral atrophy.  Comparatively mild cerebellar atrophy. Mild patchy and ill-defined hypoattenuation within the cerebral white matter, nonspecific but compatible chronic small vessel ischemic disease. There is no acute intracranial hemorrhage. No demarcated cortical infarct. No extra-axial fluid collection. No evidence of an intracranial mass. No midline shift. Vascular: No hyperdense vessel. Atherosclerotic calcifications. Skull: Normal. Negative for fracture or focal lesion. Sinuses/Orbits: Visualized orbits show no acute finding. No significant paranasal sinus disease at the imaged levels. IMPRESSION: No evidence of acute intracranial abnormality. Mild chronic small vessel ischemic changes within the cerebral white matter. Moderate cerebral atrophy.  Comparatively mild cerebellar atrophy. Electronically Signed   By: Jackey Loge D.O.   On: 08/14/2021 13:03   CT Head Wo Contrast  Result Date: 08/09/2021 CLINICAL DATA:  Larey Seat from wheelchair EXAM: CT HEAD WITHOUT CONTRAST CT CERVICAL SPINE WITHOUT CONTRAST TECHNIQUE:  Multidetector CT imaging of the head and cervical spine was performed following the standard protocol without intravenous contrast. Multiplanar CT image reconstructions of the cervical spine were also generated. COMPARISON:  06/01/2020 CT head and cervical spine. FINDINGS: CT HEAD FINDINGS Brain: No acute infarction, hemorrhage, hydrocephalus, mass, mass effect, or midline shift. Previously noted low-density collection along the left frontal convexity is no longer seen. Slightly increased size of the ventricles, which appear commensurate with degree of cerebral atrophy. Vascular: No hyperdense vessel. Skull: No acute fracture. Sinuses/Orbits: Status post right  lens replacement. The sinuses are unremarkable. Other: The mastoids are well aerated. CT CERVICAL SPINE FINDINGS Alignment: Mild straightening of the normal cervical lordosis, which may be positional. No significant listhesis. Skull base and vertebrae: No acute fracture. No primary bone lesion or focal pathologic process. Soft tissues and spinal canal: No prevertebral fluid or swelling. No visible canal hematoma. Disc levels:  Well preserved for age. Upper chest: No focal pulmonary opacity. Other: None. IMPRESSION: 1. No acute intracranial process. 2. No acute fracture or static listhesis in the cervical spine. 3. Interval resolution of previously noted trace left frontal hygroma. Electronically Signed   By: Wiliam Ke M.D.   On: 08/09/2021 11:22   CT Cervical Spine Wo Contrast  Result Date: 08/09/2021 CLINICAL DATA:  Larey Seat from wheelchair EXAM: CT HEAD WITHOUT CONTRAST CT CERVICAL SPINE WITHOUT CONTRAST TECHNIQUE: Multidetector CT imaging of the head and cervical spine was performed following the standard protocol without intravenous contrast. Multiplanar CT image reconstructions of the cervical spine were also generated. COMPARISON:  06/01/2020 CT head and cervical spine. FINDINGS: CT HEAD FINDINGS Brain: No acute infarction, hemorrhage, hydrocephalus, mass, mass effect, or midline shift. Previously noted low-density collection along the left frontal convexity is no longer seen. Slightly increased size of the ventricles, which appear commensurate with degree of cerebral atrophy. Vascular: No hyperdense vessel. Skull: No acute fracture. Sinuses/Orbits: Status post right lens replacement. The sinuses are unremarkable. Other: The mastoids are well aerated. CT CERVICAL SPINE FINDINGS Alignment: Mild straightening of the normal cervical lordosis, which may be positional. No significant listhesis. Skull base and vertebrae: No acute fracture. No primary bone lesion or focal pathologic process. Soft tissues and  spinal canal: No prevertebral fluid or swelling. No visible canal hematoma. Disc levels:  Well preserved for age. Upper chest: No focal pulmonary opacity. Other: None. IMPRESSION: 1. No acute intracranial process. 2. No acute fracture or static listhesis in the cervical spine. 3. Interval resolution of previously noted trace left frontal hygroma. Electronically Signed   By: Wiliam Ke M.D.   On: 08/09/2021 11:22   DG Chest Portable 1 View  Result Date: 08/14/2021 CLINICAL DATA:  Altered mental status EXAM: PORTABLE CHEST 1 VIEW COMPARISON:  06/10/2021 FINDINGS: Lungs are hyperinflated. Normal cardiac silhouette. No effusion, infiltrate or pneumothorax. No acute osseous abnormality. RIGHT nipple shadow noted. IMPRESSION: Hyperinflated lungs.  No acute findings. Electronically Signed   By: Genevive Bi M.D.   On: 08/14/2021 13:36       Discharge Exam: Vitals:   08/18/21 0917 08/18/21 1000  BP:    Pulse:    Resp:    Temp:    SpO2: (!) 89% 91%    General: Pt is alert, awake, non verbal at baseline  Cardiovascular: RRR, S1/S2 +, no edema Respiratory: CTA bilaterally, no wheezing, no rhonchi, no respiratory distress, on room air without distress  Abdominal: Soft, NT, ND, bowel sounds + Extremities: no edema, no cyanosis Psych: Stable  The results of significant diagnostics from this hospitalization (including imaging, microbiology, ancillary and laboratory) are listed below for reference.     Microbiology: Recent Results (from the past 240 hour(s))  Blood culture (routine x 2)     Status: None (Preliminary result)   Collection Time: 08/14/21 12:42 PM   Specimen: Left Antecubital; Blood  Result Value Ref Range Status   Specimen Description LEFT ANTECUBITAL  Final   Special Requests   Final    Blood Culture results may not be optimal due to an inadequate volume of blood received in culture bottles BOTTLES DRAWN AEROBIC AND ANAEROBIC   Culture   Final    NO GROWTH 4  DAYS Performed at [facility], [address], [place], [place] [address]    Report Status PENDING  Incomplete  Blood culture (routine x 2)     Status: None (Preliminary result)   Collection Time: 08/14/21 12:51 PM   Specimen: BLOOD RIGHT ARM  Result Value Ref Range Status   Specimen Description BLOOD RIGHT ARM  Final   Special Requests   Final    Blood Culture adequate volume BOTTLES DRAWN AEROBIC AND ANAEROBIC   Culture   Final    NO GROWTH 4 DAYS Performed at [facility], [address] [place], [place] [address]    Report Status PENDING  Incomplete  MRSA Next Gen by PCR, Nasal     Status: None   Collection Time: 08/14/21  6:30 PM   Specimen: Nasal Mucosa; Nasal Swab  Result Value Ref Range Status   MRSA by PCR Next Gen NOT DETECTED NOT DETECTED Final    Comment: (NOTE) The GeneXpert MRSA Assay (FDA approved for NASAL specimens only), is one component of a comprehensive MRSA colonization surveillance program. It is not intended to diagnose MRSA infection nor to guide or monitor treatment for MRSA infections. Test performance is not FDA approved in patients less than 39 years old. Performed at [facility], [address] [place], [place] [address]   Urine Culture     Status: Abnormal   Collection Time: 08/15/21  7:35 AM   Specimen: In/Out Cath Urine  Result Value Ref Range Status   Specimen Description   Final    IN/OUT CATH URINE Performed at [facility], [address], [place], [place] [address]    Special Requests   Final    NONE Performed at [facility], [address], [place], [place] [address]    Culture (A)  Final    >=100,000 COLONIES/mL PSEUDOMONAS AERUGINOSA 80,000 COLONIES/mL ENTEROCOCCUS FAECALIS    Report Status 08/18/2021 FINAL  Final   Organism ID, Bacteria PSEUDOMONAS AERUGINOSA (A)  Final   Organism ID, Bacteria ENTEROCOCCUS FAECALIS (A)  Final      Susceptibility   Enterococcus faecalis - MIC*    AMPICILLIN <=2 SENSITIVE Sensitive      NITROFURANTOIN <=16 SENSITIVE Sensitive     VANCOMYCIN 1 SENSITIVE Sensitive     GENTAMICIN SYNERGY RESISTANT Resistant     * 80,000 COLONIES/mL ENTEROCOCCUS FAECALIS   Pseudomonas aeruginosa - MIC*    CEFTAZIDIME 4 SENSITIVE Sensitive     CIPROFLOXACIN <=0.25 SENSITIVE Sensitive     GENTAMICIN <=1 SENSITIVE Sensitive     IMIPENEM 1 SENSITIVE Sensitive     PIP/TAZO 8 SENSITIVE Sensitive     CEFEPIME 2 SENSITIVE Sensitive     * >=100,000 COLONIES/mL PSEUDOMONAS AERUGINOSA     Labs: BNP (last 3 results) No results for input(s): BNP in the last 8760  hours. Basic Metabolic Panel: Recent Labs  Lab 08/14/21 1557 08/15/21 0522 08/16/21 0439 08/17/21 0421 08/18/21 0551  NA 153* 151* 149* 147* 144  K 4.3 3.6 3.0* 3.1* 3.8  CL 119* 116* 115* 116* 108  CO2 21* GLUCOSE 222* 222* 122* 118* 128*  BUN 45* 38* 29* 23 10  CREATININE 1.79* 1.29* 1.01* 0.83 0.64  CALCIUM 7.9* 8.4* 8.2* 8.2* 8.2*  MG  --   --   --  2.0 2.0   Liver Function Tests: Recent Labs  Lab 08/14/21 1252 08/15/21 0522 08/16/21 0439 08/17/21 0421 08/18/21 0551  AST 1,068* 2,104* 913* 360* 165*  ALT 814* 1,217* 1,006* 720* 578*  ALKPHOS 173* 160* 124 128* 149*  BILITOT 0.9 0.4 0.8 0.8 1.1  PROT 7.0 6.7 5.6* 5.2* 5.9*  ALBUMIN 3.4* 3.1* 2.7* 2.5* 2.8*   No results for input(s): LIPASE, AMYLASE in the last 168 hours. No results for input(s): AMMONIA in the last 168 hours. CBC: Recent Labs  Lab 08/14/21 1252 08/16/21 0439 08/17/21 0421 08/18/21 0712  WBC 15.3* 9.4 9.2 10.9*  NEUTROABS 14.1*  --   --   --   HGB 12.1 11.5* 10.1* 12.9  HCT 40.1 37.3 32.5* 42.3  MCV 103.9* 98.7 97.9 100.5*  PLT 175 129* 113* 95*   Cardiac Enzymes: No results for input(s): CKTOTAL, CKMB, CKMBINDEX, TROPONINI in the last 168 hours. BNP: Invalid input(s): POCBNP CBG: Recent Labs  Lab 08/14/21 1238 08/18/21 0706  GLUCAP 115* 108*   D-Dimer No results for input(s): DDIMER in the last 72 hours. Hgb  A1c No results for input(s): HGBA1C in the last 72 hours. Lipid Profile No results for input(s): CHOL, HDL, LDLCALC, TRIG, CHOLHDL, LDLDIRECT in the last 72 hours. Thyroid function studies No results for input(s): TSH, T4TOTAL, T3FREE, THYROIDAB in the last 72 hours.  Invalid input(s): FREET3 Anemia work up No results for input(s): VITAMINB12, FOLATE, FERRITIN, TIBC, IRON, RETICCTPCT in the last 72 hours. Urinalysis    Component Value Date/Time   COLORURINE YELLOW 08/14/2021 1411   APPEARANCEUR HAZY (A) 08/14/2021 1411   APPEARANCEUR Clear 08/20/2013 0340   LABSPEC 1.025 08/14/2021 1411   LABSPEC 1.010 08/20/2013 0340   PHURINE 5.5 08/14/2021 1411   GLUCOSEU 100 (A) 08/14/2021 1411   GLUCOSEU Negative 08/20/2013 0340   HGBUR LARGE (A) 08/14/2021 1411   BILIRUBINUR NEGATIVE 08/14/2021 1411   BILIRUBINUR Negative 08/20/2013 0340   KETONESUR NEGATIVE 08/14/2021 1411   PROTEINUR 30 (A) 08/14/2021 1411   NITRITE NEGATIVE 08/14/2021 1411   LEUKOCYTESUR NEGATIVE 08/14/2021 1411   LEUKOCYTESUR Negative 08/20/2013 0340   Sepsis Labs Invalid input(s): PROCALCITONIN,  WBC,  LACTICIDVEN Microbiology Recent Results (from the past 240 hour(s))  Blood culture (routine x 2)     Status: None (Preliminary result)   Collection Time: 08/14/21 12:42 PM   Specimen: Left Antecubital; Blood  Result Value Ref Range Status   Specimen Description LEFT ANTECUBITAL  Final   Special Requests   Final    Blood Culture results may not be optimal due to an inadequate volume of blood received in culture bottles BOTTLES DRAWN AEROBIC AND ANAEROBIC   Culture   Final    NO GROWTH 4 DAYS Performed at [facility], [address], [place], [place] [address]    Report Status PENDING  Incomplete  Blood culture (routine x 2)     Status: None (Preliminary result)   Collection Time: 08/14/21 12:51 PM   Specimen: BLOOD RIGHT ARM  Result Value Ref Range Status   Specimen Description BLOOD RIGHT ARM  Final    Special Requests   Final    Blood Culture adequate volume BOTTLES DRAWN AEROBIC AND ANAEROBIC   Culture   Final    NO GROWTH 4 DAYS Performed at [facility], [address] [place], [place] [address]    Report Status PENDING  Incomplete  MRSA Next Gen by PCR, Nasal     Status: None   Collection Time: 08/14/21  6:30 PM   Specimen: Nasal Mucosa; Nasal Swab  Result Value Ref Range Status   MRSA by PCR Next Gen NOT DETECTED NOT DETECTED Final    Comment: (NOTE) The GeneXpert MRSA Assay (FDA approved for NASAL specimens only), is one component of a comprehensive MRSA colonization surveillance program. It is not intended to diagnose MRSA infection nor to guide or monitor treatment for MRSA infections. Test performance is not FDA approved in patients less than 66 years old. Performed at [facility], [address] [place], [place] [address]   Urine Culture     Status: Abnormal   Collection Time: 08/15/21  7:35 AM   Specimen: In/Out Cath Urine  Result Value Ref Range Status   Specimen Description   Final    IN/OUT CATH URINE Performed at [facility], [address], [place], [place] [address]    Special Requests   Final    NONE Performed at [facility], [address] [place], [place] [address]    Culture (A)  Final    >=100,000 COLONIES/mL PSEUDOMONAS AERUGINOSA 80,000 COLONIES/mL ENTEROCOCCUS FAECALIS    Report Status 08/18/2021 FINAL  Final   Organism ID, Bacteria PSEUDOMONAS AERUGINOSA (A)  Final   Organism ID, Bacteria ENTEROCOCCUS FAECALIS (A)  Final      Susceptibility   Enterococcus faecalis - MIC*    AMPICILLIN <=2 SENSITIVE Sensitive     NITROFURANTOIN <=16 SENSITIVE Sensitive     VANCOMYCIN 1 SENSITIVE Sensitive     GENTAMICIN SYNERGY RESISTANT Resistant     * 80,000 COLONIES/mL ENTEROCOCCUS FAECALIS   Pseudomonas aeruginosa - MIC*    CEFTAZIDIME 4 SENSITIVE Sensitive     CIPROFLOXACIN <=0.25 SENSITIVE Sensitive     GENTAMICIN <=1 SENSITIVE Sensitive      IMIPENEM 1 SENSITIVE Sensitive     PIP/TAZO 8 SENSITIVE Sensitive     CEFEPIME 2 SENSITIVE Sensitive     * >=100,000 COLONIES/mL PSEUDOMONAS AERUGINOSA     Patient was seen and examined on the day of discharge and was found to be in stable condition. Time coordinating discharge: 35 minutes including assessment and coordination of care, as well as examination of the patient.   SIGNED:  Noralee Stain, DO [facility] 08/18/2021, 10:25 AM

## 2021-08-18 NOTE — Progress Notes (Signed)
Difficult to wake this morning but once awake was alert and ate two containers of applesauce.  Mostly nonverbal.  Had loose bm.

## 2021-08-18 NOTE — [place] FL2 (Signed)
[place] MEDICAID FL2 LEVEL OF CARE SCREENING TOOL     IDENTIFICATION  Patient Name: Natalie Stevenson Birthdate: Apr 25, 1941 Sex: female Admission Date (Current Location): 08/14/2021  [facility] and [place] Number:  [employer] and Address:  [facility],  [address], [place] [address]      Provider Number: [id number]  Attending Physician Name and Address:  Noralee Stain, DO  Relative Name and Phone Number:       Current Level of Care: Hospital Recommended Level of Care: Assisted Living Facility Prior Approval Number:    Date Approved/Denied:   PASRR Number:    Discharge Plan: Other (Comment) (ALF)    Current Diagnoses: Patient Active Problem List   Diagnosis Date Noted   Severe sepsis (HCC) 08/14/2021   Dementia (HCC) 08/14/2021   Hypothyroidism 08/14/2021   Acute metabolic encephalopathy 08/14/2021   Elevated troponin 08/14/2021   AKI (acute kidney injury) (HCC) 08/14/2021   Elevated liver enzymes 08/14/2021    Orientation RESPIRATION BLADDER Height & Weight     Self    Incontinent Weight: 78 lb 7.7 oz (35.6 kg) Height:  5\' 5"  (165.1 cm)  BEHAVIORAL SYMPTOMS/MOOD NEUROLOGICAL BOWEL NUTRITION STATUS      Incontinent Diet  AMBULATORY STATUS COMMUNICATION OF NEEDS Skin   Total Care Non-Verbally Normal                       Personal Care Assistance Level of Assistance  Bathing, Feeding, Dressing Bathing Assistance: Maximum assistance Feeding assistance: Maximum assistance Dressing Assistance: Maximum assistance     Functional Limitations Info  Sight, Hearing, Speech Sight Info: Adequate Hearing Info: Adequate Speech Info: Impaired    SPECIAL CARE FACTORS FREQUENCY                       Contractures Contractures Info: Not present    Additional Factors Info  Code Status, Allergies Code Status Info: DNR Allergies Info: NKA            Discharge Medications: TAKE these medications     acetaminophen 500  MG tablet Commonly known as: TYLENOL Take 500 mg by mouth daily.    amLODipine 10 MG tablet Commonly known as: NORVASC Take 10 mg by mouth daily.    ampicillin 500 MG capsule Commonly known as: PRINCIPEN Take 1 capsule (500 mg total) by mouth 4 (four) times daily for 4 days.    ciprofloxacin 500 MG tablet Commonly known as: Cipro Take 1 tablet (500 mg total) by mouth 2 (two) times daily for 4 days.    diclofenac Sodium 1 % Gel Commonly known as: VOLTAREN Apply 1 application topically 3 (three) times daily as needed for pain.    donepezil 10 MG tablet Commonly known as: ARICEPT Take 10 mg by mouth daily.    lactose free nutrition Liqd Take 237 mLs by mouth 2 (two) times daily between meals.    levothyroxine 200 MCG tablet Commonly known as: SYNTHROID Take 200 mcg by mouth daily before breakfast.    levothyroxine 25 MCG tablet Commonly known as: SYNTHROID Take 25 mcg by mouth daily before breakfast.    lisinopril 20 MG tablet Commonly known as: ZESTRIL Take 20 mg by mouth daily.    melatonin 3 MG Tabs tablet Take 3 mg by mouth at bedtime.    memantine 10 MG tablet Commonly known as: NAMENDA Take 10 mg by mouth daily.    polyethylene glycol 17 g packet Commonly known  as: MIRALAX / GLYCOLAX Take 17 g by mouth daily as needed for mild constipation.   Relevant Imaging Results:  Relevant Lab Results:   Additional Information SSN: [id number] [place]

## 2021-08-18 NOTE — Care Management Important Message (Signed)
Important Message  Patient Details  Name: Natalie Stevenson MRN: [id number] Date of Birth: [birth year]   Medicare Important Message Given:  Yes     Corey Harold 08/18/2021, 11:03 AM

## 2021-08-19 LAB — CULTURE, BLOOD (ROUTINE X 2)
Culture: NO GROWTH
Culture: NO GROWTH
Special Requests: ADEQUATE

## 2021-09-28 DEATH — deceased

## 2023-07-15 IMAGING — CT CT ABD-PELV W/O CM
2 of 4 series · 5 of 42 positions shown, 9 images · non-contrast
Comparison: CT abdomen 04/09/2011.

CLINICAL DATA: Sepsis, hypotension, elevated liver enzymes, acute
kidney injury.

EXAM:
CT ABDOMEN AND PELVIS WITHOUT CONTRAST
TECHNIQUE: Multidetector CT imaging of the abdomen and pelvis was performed
following the standard protocol without IV contrast.

[Series 4: lung bases · axial · 0.41mm/px · z∈[+1326,+1366]mm · 2 of 61 slices shown, 5 images]
[im 21/61  soft-tissue]
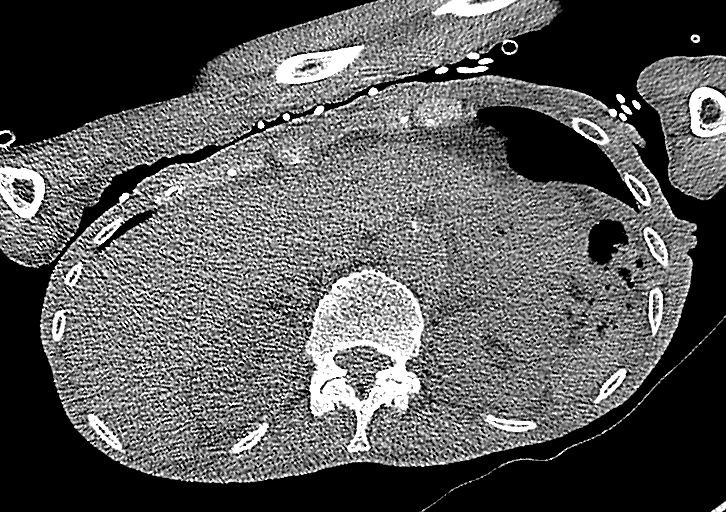
[im 21/61  lung]
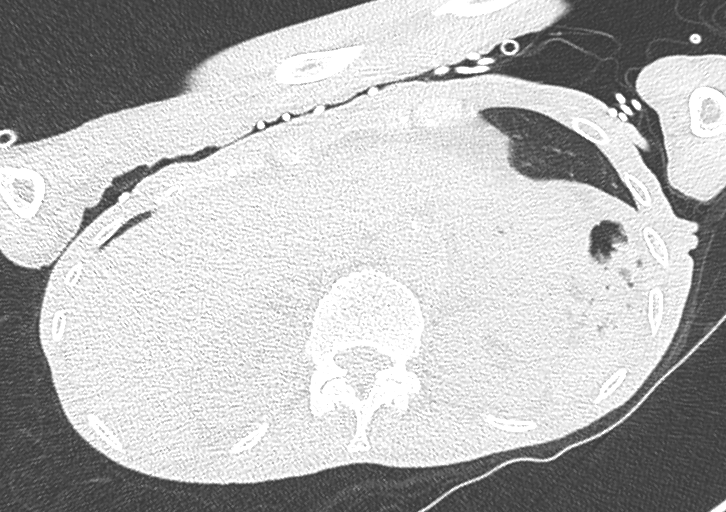
[im 21/61  bone]
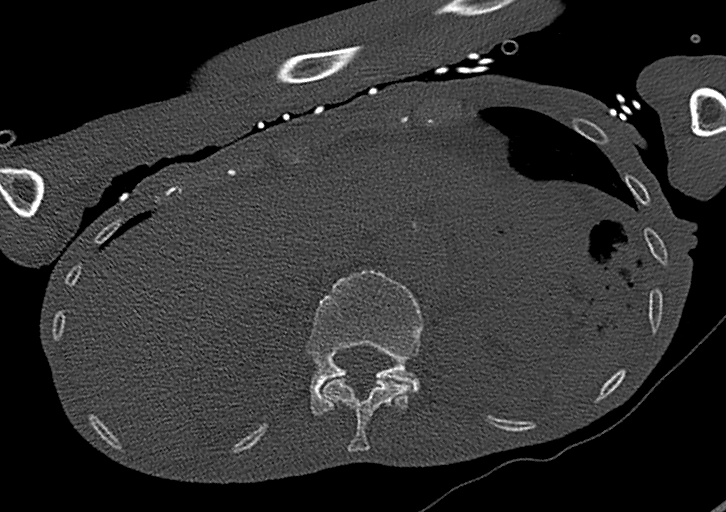
[im 41/61  soft-tissue]
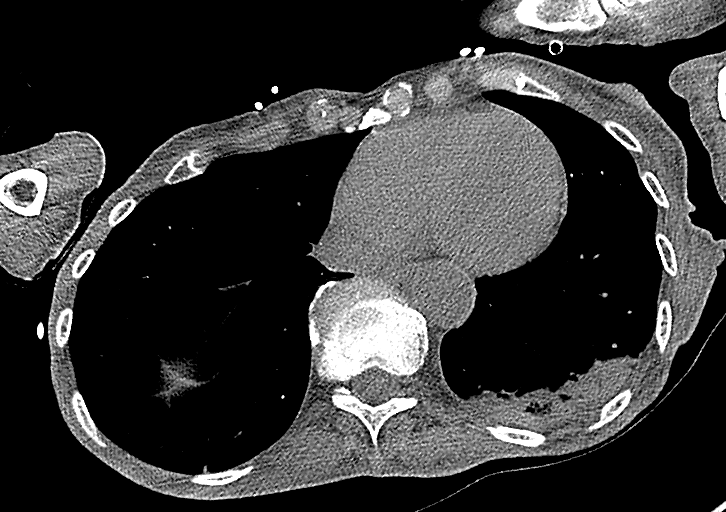
[im 41/61  lung]
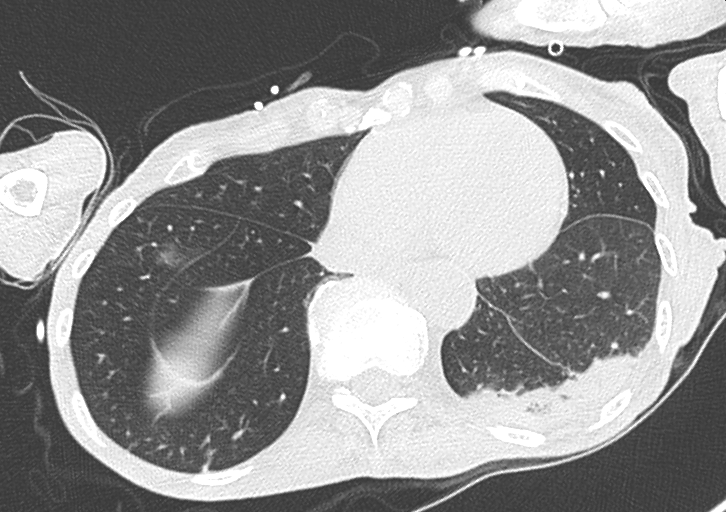

[Series 6: coronal st · coronal · 0.55mm/px · 3 of 59 slices shown, 4 images]
[im 20/59  soft-tissue]
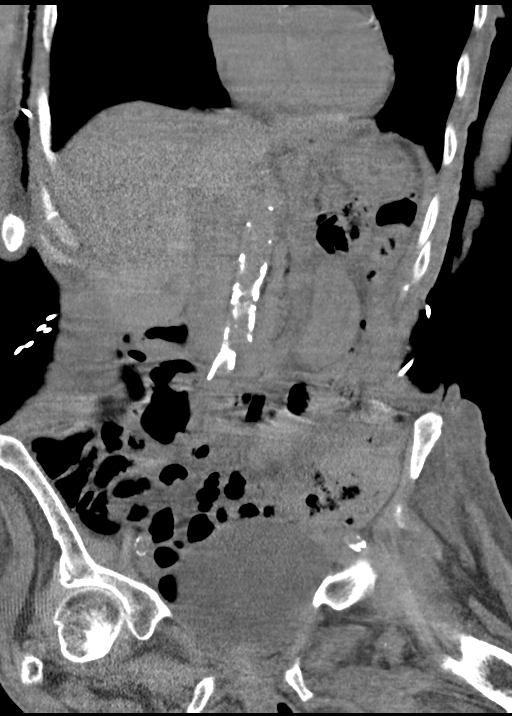
[im 26/59  soft-tissue]
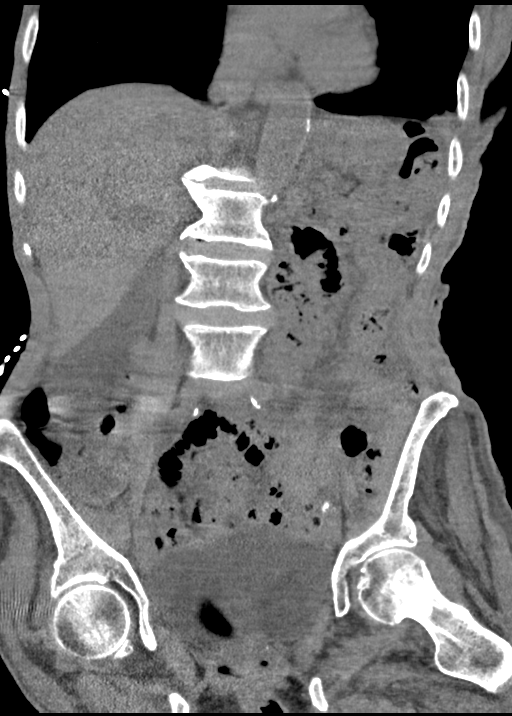
[im 26/59  bone]
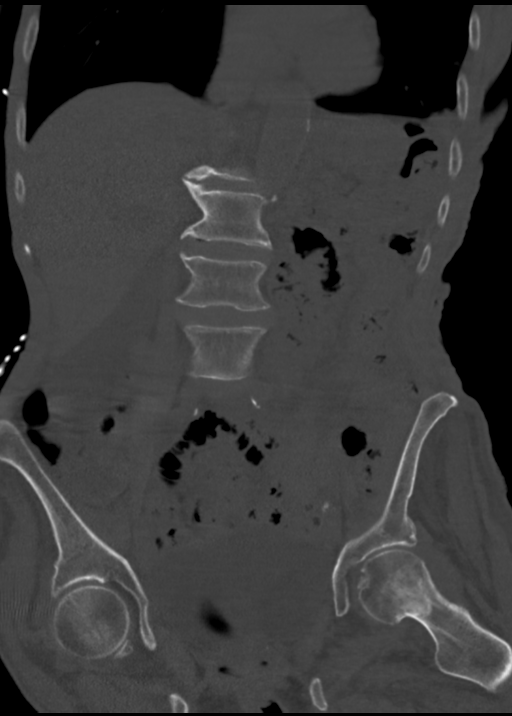
[im 33/59  soft-tissue]
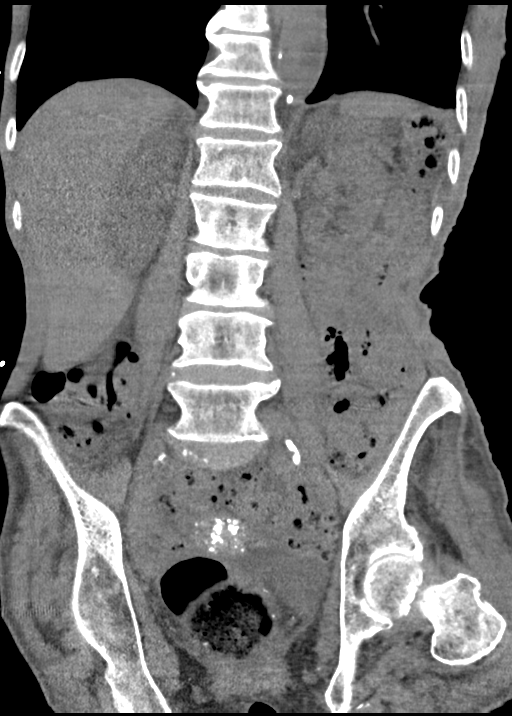

[5 of 42 positions shown; findings below may reference images not displayed]

FINDINGS: Lower chest: There is atelectasis and airspace disease in the left
lower lobe. There are minimal patchy ground-glass opacities in the
right lower lobe/lung base.

Hepatobiliary: No focal liver abnormality is seen. No gallstones,
gallbladder wall thickening, or biliary dilatation.

Pancreas: Not well delineated.  Grossly within normal limits.

Spleen: Normal in size without focal abnormality.

Adrenals/Urinary Tract: There is a 1.5 cm cyst in the superior pole
the right kidney. There is no urinary tract calculus or
hydronephrosis. Kidneys are normal in size. Adrenal glands are
within normal limits. There is an air-fluid level in the bladder.

Stomach/Bowel: Evaluation is limited secondary to lack of
intraperitoneal fat and lack of contrast. There is no definite bowel
obstruction. No dilated bowel loops are seen. The appendix is not
visualized. There is a large amount of stool in the rectum.

Vascular/Lymphatic: Aortic atherosclerosis. No enlarged abdominal or
pelvic lymph nodes.

Reproductive: There are calcified uterine fibroids with the largest
measuring 3.5 cm. Ovaries are not well evaluated.

Other: No ascites.  No free air.  No focal abdominal wall hernia.

Musculoskeletal: No acute fractures are seen.
IMPRESSION: 1. Air-fluid level in the bladder. Please correlate clinically for
infection.
2. Large amount of stool in the rectum.  No bowel obstruction.
3. Calcified uterine fibroids.
4. Left lower lobe atelectasis and airspace disease.
5.  Aortic Atherosclerosis ([id number]).

## 2023-07-15 IMAGING — CT CT HEAD W/O CM
3 series · 15 of 47 positions shown, 18 images · non-contrast
Comparison: Prior head CT examinations 08/09/2021 and earlier.

CLINICAL DATA: Delirium.  Hypotension.

EXAM:
CT HEAD WITHOUT CONTRAST
TECHNIQUE: Contiguous axial images were obtained from the base of the skull
through the vertex without intravenous contrast.

[Series 2: head w o · axial · 0.42mm/px · z∈[+56,+181]mm · 9 of 31 slices shown, 12 images]
[im 3/31  brain]
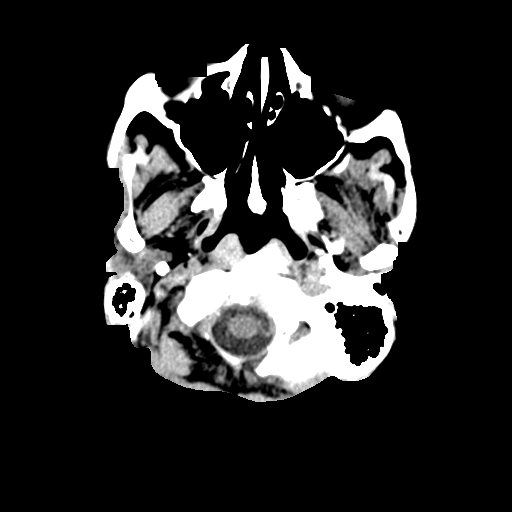
[im 3/31  bone]
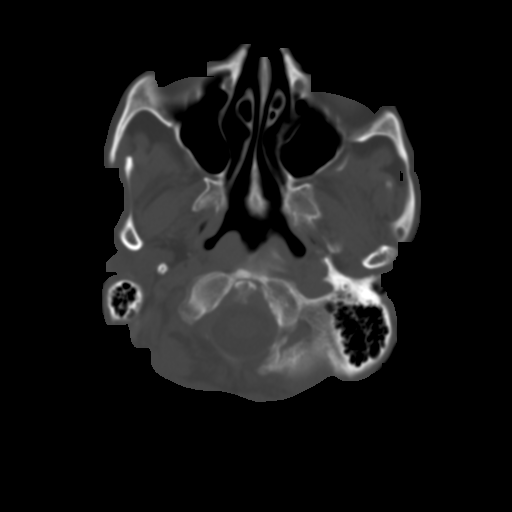
[im 6/31  brain]
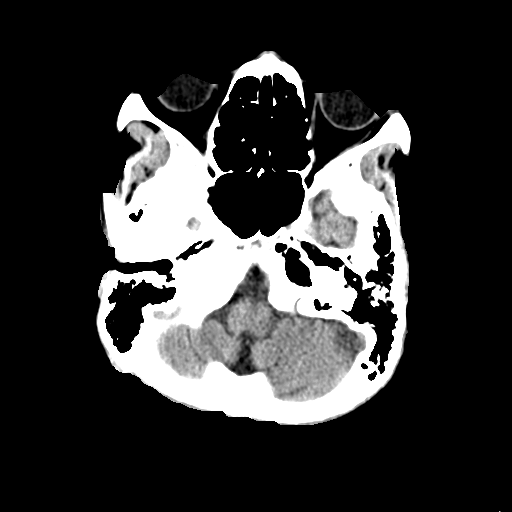
[im 9/31  brain]
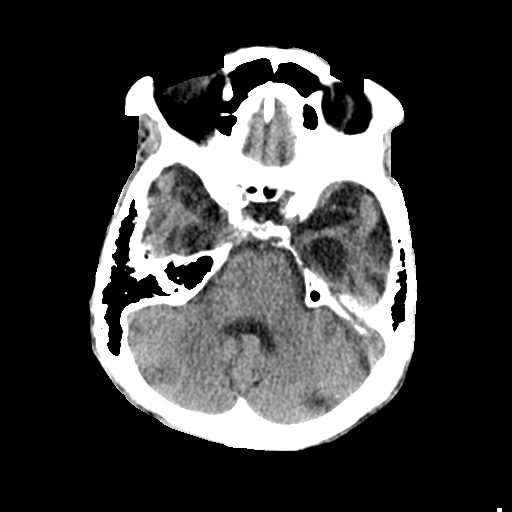
[im 12/31  brain]
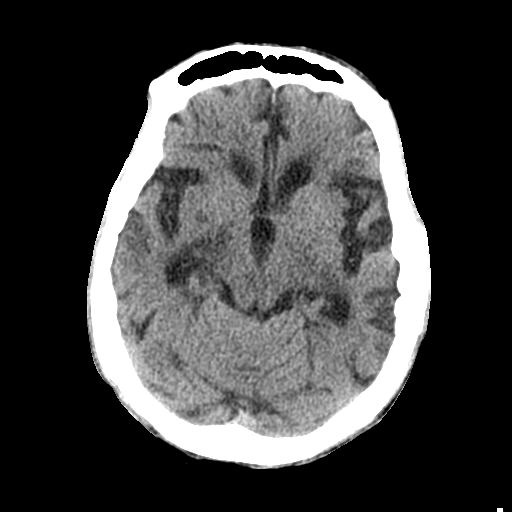
[im 16/31  brain]
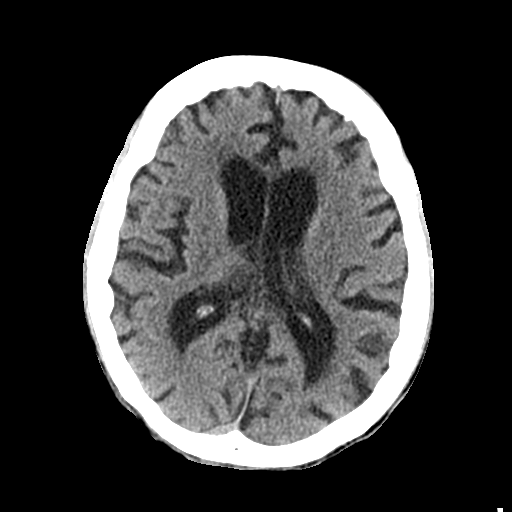
[im 16/31  bone]
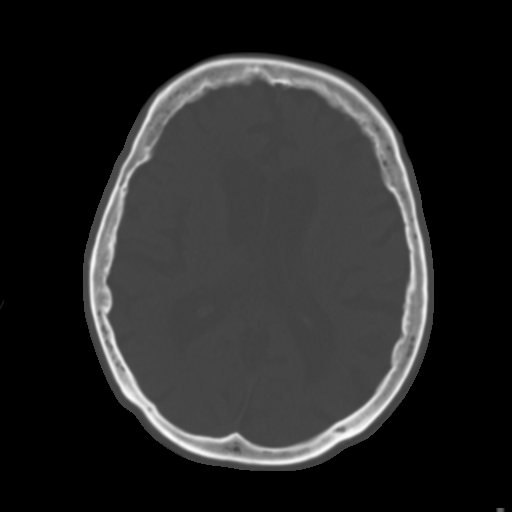
[im 19/31  brain]
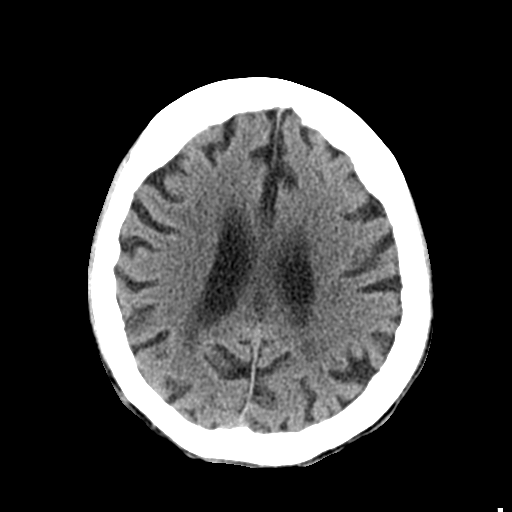
[im 22/31  brain]
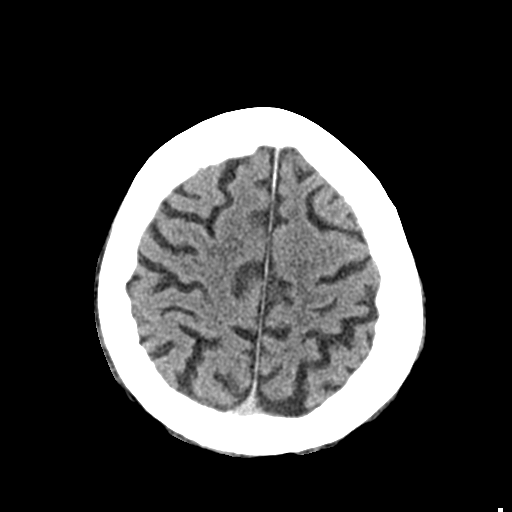
[im 25/31  brain]
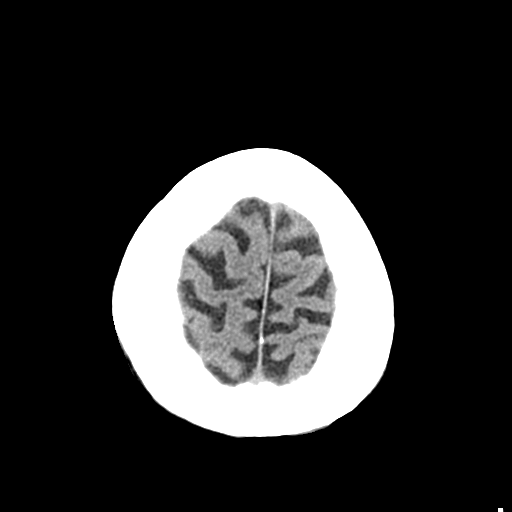
[im 28/31  brain]
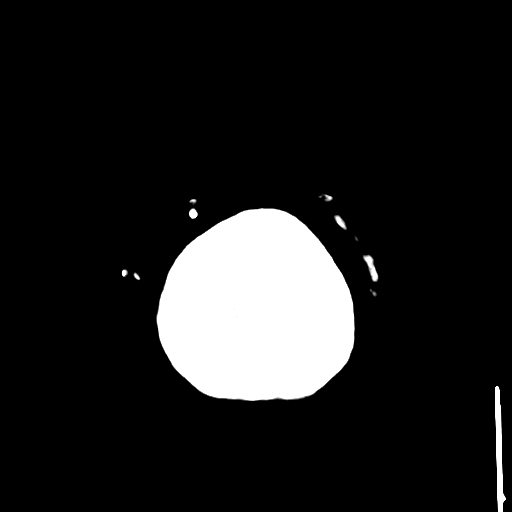
[im 28/31  bone]
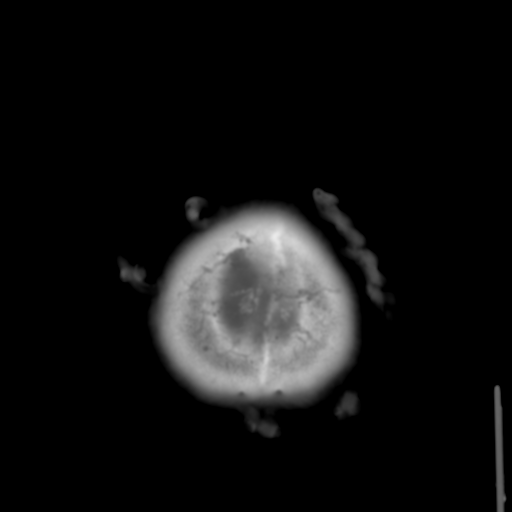

[Series 4: coronal soft · coronal · 0.31mm/px · 3 of 67 slices shown]
[im 23/67  brain]
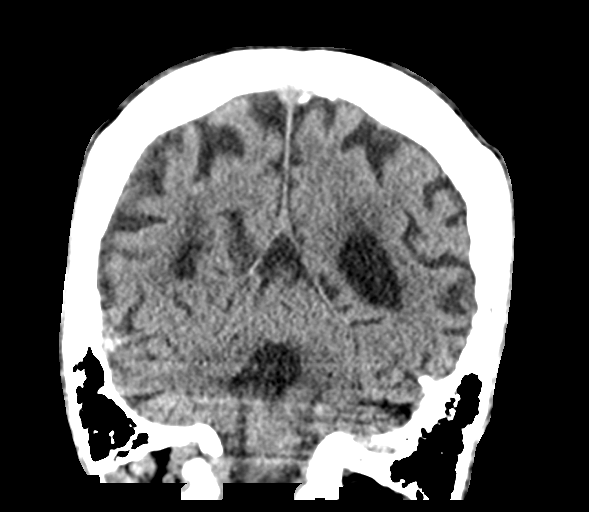
[im 30/67  brain]
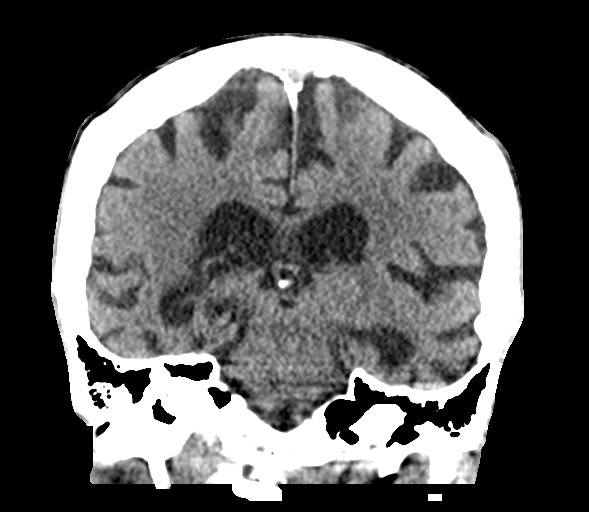
[im 37/67  brain]
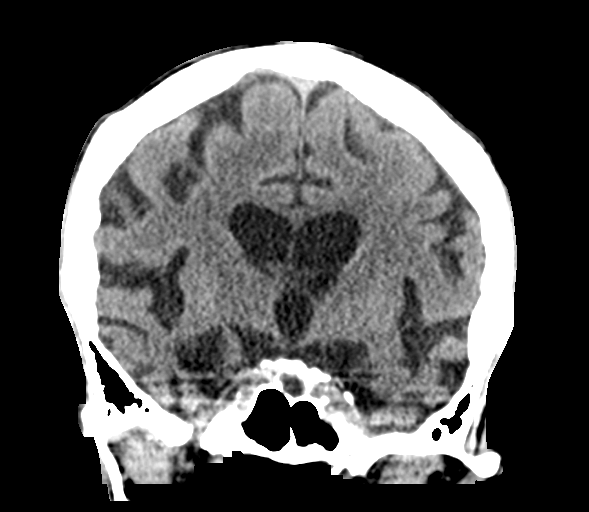

[Series 5: sagittal soft · sagittal · 0.30mm/px · 3 of 55 slices shown]
[im 19/55  brain]
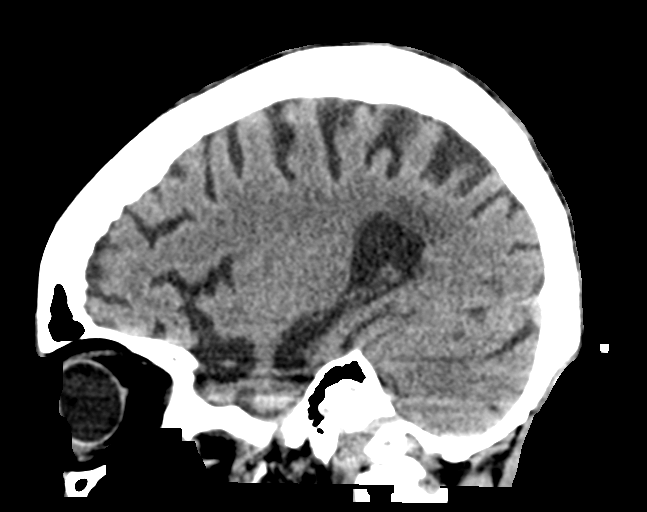
[im 28/55  brain]
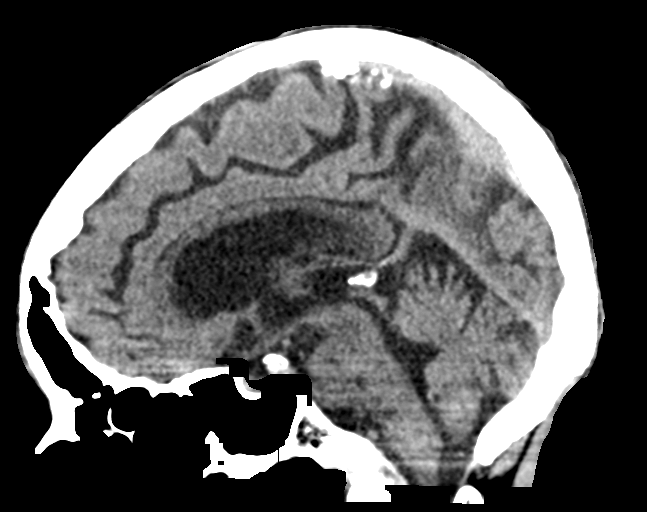
[im 37/55  brain]
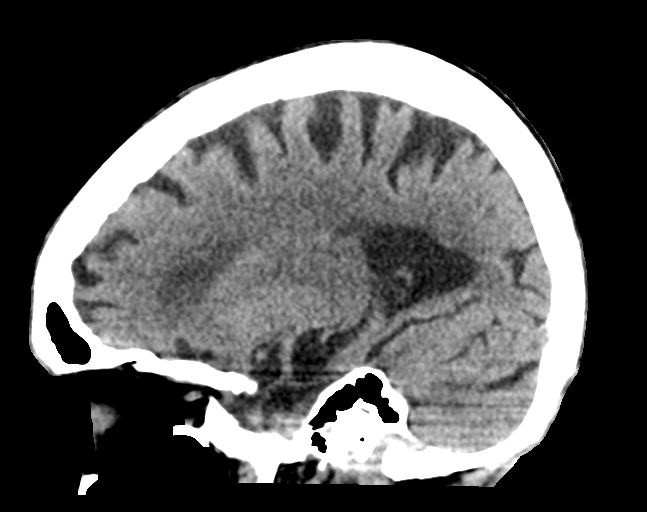

[15 of 47 positions shown; findings below may reference images not displayed]

FINDINGS: Brain:

Moderate cerebral atrophy.  Comparatively mild cerebellar atrophy.

Mild patchy and ill-defined hypoattenuation within the cerebral
white matter, nonspecific but compatible chronic small vessel
ischemic disease.

There is no acute intracranial hemorrhage.

No demarcated cortical infarct.

No extra-axial fluid collection.

No evidence of an intracranial mass.

No midline shift.

Vascular: No hyperdense vessel. Atherosclerotic calcifications.

Skull: Normal. Negative for fracture or focal lesion.

Sinuses/Orbits: Visualized orbits show no acute finding. No
significant paranasal sinus disease at the imaged levels.
IMPRESSION: No evidence of acute intracranial abnormality.

Mild chronic small vessel ischemic changes within the cerebral white
matter.

Moderate cerebral atrophy.  Comparatively mild cerebellar atrophy.
